# Patient Record
Sex: Female | Born: 1935 | Race: Black or African American | Hispanic: No | Smoking: Never smoker
Health system: Southern US, Community
[De-identification: ages and names within clinical notes are randomized; demographics above are authoritative.]

## PROBLEM LIST (undated history)

## (undated) DIAGNOSIS — R011 Cardiac murmur, unspecified: Secondary | ICD-10-CM

## (undated) DIAGNOSIS — R51 Headache: Secondary | ICD-10-CM

## (undated) DIAGNOSIS — M545 Low back pain, unspecified: Secondary | ICD-10-CM

## (undated) DIAGNOSIS — K219 Gastro-esophageal reflux disease without esophagitis: Secondary | ICD-10-CM

## (undated) DIAGNOSIS — G8929 Other chronic pain: Secondary | ICD-10-CM

## (undated) DIAGNOSIS — T7840XA Allergy, unspecified, initial encounter: Secondary | ICD-10-CM

## (undated) DIAGNOSIS — M109 Gout, unspecified: Secondary | ICD-10-CM

## (undated) DIAGNOSIS — J449 Chronic obstructive pulmonary disease, unspecified: Secondary | ICD-10-CM

## (undated) DIAGNOSIS — M1990 Unspecified osteoarthritis, unspecified site: Secondary | ICD-10-CM

## (undated) DIAGNOSIS — Z9289 Personal history of other medical treatment: Secondary | ICD-10-CM

## (undated) DIAGNOSIS — F329 Major depressive disorder, single episode, unspecified: Secondary | ICD-10-CM

## (undated) DIAGNOSIS — R0609 Other forms of dyspnea: Secondary | ICD-10-CM

## (undated) DIAGNOSIS — I509 Heart failure, unspecified: Secondary | ICD-10-CM

## (undated) DIAGNOSIS — E875 Hyperkalemia: Secondary | ICD-10-CM

## (undated) DIAGNOSIS — B1920 Unspecified viral hepatitis C without hepatic coma: Secondary | ICD-10-CM

## (undated) DIAGNOSIS — I10 Essential (primary) hypertension: Secondary | ICD-10-CM

## (undated) DIAGNOSIS — D509 Iron deficiency anemia, unspecified: Secondary | ICD-10-CM

## (undated) DIAGNOSIS — J189 Pneumonia, unspecified organism: Secondary | ICD-10-CM

## (undated) DIAGNOSIS — J45909 Unspecified asthma, uncomplicated: Secondary | ICD-10-CM

## (undated) DIAGNOSIS — R0600 Dyspnea, unspecified: Secondary | ICD-10-CM

## (undated) DIAGNOSIS — Z8489 Family history of other specified conditions: Secondary | ICD-10-CM

## (undated) DIAGNOSIS — I209 Angina pectoris, unspecified: Secondary | ICD-10-CM

## (undated) DIAGNOSIS — N390 Urinary tract infection, site not specified: Secondary | ICD-10-CM

## (undated) HISTORY — DX: Allergy, unspecified, initial encounter: T78.40XA

## (undated) HISTORY — DX: Hyperkalemia: E87.5

## (undated) HISTORY — PX: TOTAL KNEE ARTHROPLASTY: SHX125

## (undated) HISTORY — PX: DILATION AND CURETTAGE OF UTERUS: SHX78

## (undated) HISTORY — PX: JOINT REPLACEMENT: SHX530

---
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
## 2004-07-30 HISTORY — PX: CHOLECYSTECTOMY: SHX55

## 2005-05-14 ENCOUNTER — Ambulatory Visit (HOSPITAL_COMMUNITY): Admission: RE | Admit: 2005-05-14 | Discharge: 2005-05-14 | Payer: Self-pay | Admitting: Internal Medicine

## 2005-05-27 ENCOUNTER — Observation Stay (HOSPITAL_COMMUNITY): Admission: EM | Admit: 2005-05-27 | Discharge: 2005-05-28 | Payer: Self-pay | Admitting: *Deleted

## 2005-06-28 ENCOUNTER — Ambulatory Visit (HOSPITAL_COMMUNITY): Admission: RE | Admit: 2005-06-28 | Discharge: 2005-06-28 | Payer: Self-pay | Admitting: General Surgery

## 2005-07-04 ENCOUNTER — Encounter: Payer: Self-pay | Admitting: Cardiology

## 2005-07-04 ENCOUNTER — Ambulatory Visit: Admission: RE | Admit: 2005-07-04 | Discharge: 2005-07-04 | Payer: Self-pay | Admitting: General Surgery

## 2005-07-04 ENCOUNTER — Ambulatory Visit: Payer: Self-pay | Admitting: Cardiology

## 2005-07-20 ENCOUNTER — Encounter (INDEPENDENT_AMBULATORY_CARE_PROVIDER_SITE_OTHER): Payer: Self-pay | Admitting: Specialist

## 2005-07-20 ENCOUNTER — Ambulatory Visit (HOSPITAL_COMMUNITY): Admission: RE | Admit: 2005-07-20 | Discharge: 2005-07-21 | Payer: Self-pay | Admitting: General Surgery

## 2005-11-23 ENCOUNTER — Ambulatory Visit: Payer: Self-pay | Admitting: Gastroenterology

## 2006-01-18 ENCOUNTER — Encounter (INDEPENDENT_AMBULATORY_CARE_PROVIDER_SITE_OTHER): Payer: Self-pay | Admitting: *Deleted

## 2006-01-18 ENCOUNTER — Ambulatory Visit (HOSPITAL_COMMUNITY): Admission: RE | Admit: 2006-01-18 | Discharge: 2006-01-18 | Payer: Self-pay | Admitting: Gastroenterology

## 2006-04-11 ENCOUNTER — Ambulatory Visit: Payer: Self-pay | Admitting: Nurse Practitioner

## 2007-06-17 ENCOUNTER — Emergency Department (HOSPITAL_COMMUNITY): Admission: EM | Admit: 2007-06-17 | Discharge: 2007-06-17 | Payer: Self-pay | Admitting: Emergency Medicine

## 2007-10-01 ENCOUNTER — Ambulatory Visit (HOSPITAL_COMMUNITY): Admission: RE | Admit: 2007-10-01 | Discharge: 2007-10-01 | Payer: Self-pay | Admitting: Internal Medicine

## 2007-11-05 ENCOUNTER — Encounter: Admission: RE | Admit: 2007-11-05 | Discharge: 2007-11-05 | Payer: Self-pay | Admitting: Orthopedic Surgery

## 2008-08-24 ENCOUNTER — Emergency Department (HOSPITAL_COMMUNITY): Admission: EM | Admit: 2008-08-24 | Discharge: 2008-08-24 | Payer: Self-pay | Admitting: Emergency Medicine

## 2009-08-15 ENCOUNTER — Emergency Department (HOSPITAL_BASED_OUTPATIENT_CLINIC_OR_DEPARTMENT_OTHER): Admission: EM | Admit: 2009-08-15 | Discharge: 2009-08-15 | Payer: Self-pay | Admitting: Emergency Medicine

## 2009-10-05 ENCOUNTER — Ambulatory Visit (HOSPITAL_COMMUNITY): Admission: RE | Admit: 2009-10-05 | Discharge: 2009-10-05 | Payer: Self-pay | Admitting: Internal Medicine

## 2010-08-20 ENCOUNTER — Encounter: Payer: Self-pay | Admitting: Gastroenterology

## 2010-08-20 ENCOUNTER — Encounter: Payer: Self-pay | Admitting: Internal Medicine

## 2010-08-21 ENCOUNTER — Encounter: Payer: Self-pay | Admitting: Internal Medicine

## 2010-10-16 LAB — BASIC METABOLIC PANEL
BUN: 15 mg/dL (ref 6–23)
CO2: 31 mEq/L (ref 19–32)
Calcium: 9.1 mg/dL (ref 8.4–10.5)
Chloride: 103 mEq/L (ref 96–112)
Creatinine, Ser: 0.8 mg/dL (ref 0.4–1.2)
GFR calc Af Amer: >60 mL/min (ref >60)
GFR calc non Af Amer: >60 mL/min (ref >60)
Glucose, Bld: 108 mg/dL — ABNORMAL HIGH (ref 70–99)
Potassium: 3.5 mEq/L (ref 3.5–5.1)
Sodium: 143 mEq/L (ref 135–145)

## 2010-10-16 LAB — CBC
HCT: 36.9 % (ref 36.0–46.0)
Hemoglobin: 12.2 g/dL (ref 12.0–15.0)
MCHC: 33 g/dL (ref 30.0–36.0)
MCV: 85.5 fL (ref 78.0–100.0)
Platelets: 284 10*3/uL (ref 150–400)
RBC: 4.31 MIL/uL (ref 3.87–5.11)
RDW: 13.9 % (ref 11.5–15.5)
WBC: 7.8 10*3/uL (ref 4.0–10.5)

## 2010-10-16 LAB — DIFFERENTIAL
Basophils Absolute: 0.2 10*3/uL — ABNORMAL HIGH (ref 0.0–0.1)
Basophils Relative: 3 % — ABNORMAL HIGH (ref 0–1)
Eosinophils Absolute: 0.1 10*3/uL (ref 0.0–0.7)
Eosinophils Relative: 1 % (ref 0–5)
Lymphocytes Relative: 20 % (ref 12–46)
Lymphs Abs: 1.5 10*3/uL (ref 0.7–4.0)
Monocytes Absolute: 0.7 10*3/uL (ref 0.1–1.0)
Monocytes Relative: 9 % (ref 3–12)
Neutro Abs: 5.3 10*3/uL (ref 1.7–7.7)
Neutrophils Relative %: 68 % (ref 43–77)

## 2010-12-15 NOTE — Op Note (Signed)
NAMESHASHA, BUCHBINDER                ACCOUNT NO.:  [id number]   MEDICAL RECORD NO.:  [id number]          PATIENT TYPE:  OIB   LOCATION:  1613                         FACILITY:  [facility]   PHYSICIAN:  Leonie Man, M.D.   DATE OF BIRTH:  [birth year]   DATE OF PROCEDURE:  07/20/2005  DATE OF DISCHARGE:  07/21/2005                                 OPERATIVE REPORT   PREOPERATIVE DIAGNOSIS:  Chronic calculous cholecystitis.   POSTOPERATIVE DIAGNOSIS:  Chronic calculous cholecystitis.   PROCEDURE:  Laparoscopic cholecystectomy.   SURGEON:  Ballen.   ASSISTANT:  Dr. Marcy Panning.   ANESTHESIA:  General.   SPECIMENS TO PATHOLOGY LAB:  Gallbladder with stones.   ESTIMATED BLOOD LOSS:  Minimal.   COMPLICATIONS:  There are no complications and the patient returned to the  PACU in good condition.   INDICATIONS:  The patient is a 77 year old female with history of abdominal  pain, nausea, and vomiting, documented gallstones on ultrasound, without any  acute inflammatory changes. The patient comes to the operating room after  risks and potential benefits of surgery have been discussed. All questions  answered, consent obtained.   PROCEDURE IN DETAIL:  Following the induction of satisfactory general  anesthesia, the patient is positioned supinely. The abdomen prepped and  draped to be included in a sterile operative field. Open laparoscopy created  at the umbilicus with insertion of a Hassan cannula. The abdomen is  insufflated with carbon dioxide to 14 mmHg pressure. The camera is inserted  and visual exploration carried out.  The liver shows nodular cirrhosis. The  gallbladder is not inflamed but is redundant. Multiple adhesions from  chronic inflammation to the gallbladder from the omentum and the duodenum  are noted.  Neither the small or large intestine appeared to be abnormal.  Pelvic organs were not visualized.   Under direct vision, epigastric and lateral ports were placed. The  gallbladder is grasped and retracted cephalad. This was somewhat difficult  due to the patient's hard, cirrhotic liver. Dissection was carried out,  taking down the adhesions from the gallbladder were carried out, dissection  carried down to the ampulla where the cystic artery and cystic duct were  positively identified tracing the cystic artery up to the cystic and  attention was to gallbladder wall and tracing up the cystic duct gallbladder  junction. The cystic duct was clipped proximally and opened. A cystic duct  cholangiogram was carried out by passing a Cook catheter into the abdomen  and placing it into the cystic duct through which one half strength  Omnipaque was injected into the extrahepatic biliary system under  fluoroscopic guidance. The resulting cholangiogram showed free flow of  contrast into the duodenum, normal distal tapering.  Both right and left  hepatic radicles appeared to be normal and there were no filling defects.   The cholangiocatheter was removed and the cystic duct was triply clipped and  transected. The cystic artery was doubly clipped and transected. The  gallbladder was then dissected free from the liver bed using electrocautery  throughout the course of dissection. At the end of  dissection, the  gallbladder was placed in an Endopouch. The liver bed was again inspected  and additional bleeding points treated with electrocautery. The right upper  quadrant then thoroughly irrigated with multiple aliquots of normal saline.  The camera moved to the epigastric port to allow removal of the gallbladder  through the umbilical port which are proceeded without difficulty. Sponge,  instrument, and sharp counts were then verified. The pneumoperitoneum was  allowed to deflate after the trocars were removed under direct vision.  Wounds were closed in layers as follows: The umbilical wound into layers  with zero Vicryl and 4-0 Monocryl, epigastric and lateral flank  wounds  closed with 4-0 Monocryl sutures. All wounds reinforced with Steri-Strips.  Sterile dressings applied. Anesthetic reversed and the patient removed from  the operating room to the recovery room in stable condition. She tolerated  the procedure well.      Leonie Man, M.D.  Electronically Signed     PB/MEDQ  D:  07/20/2005  T:  07/24/2005  Job:  [id number]   cc:   Fleet Contras, M.D.  Fax: [phone]

## 2011-04-06 ENCOUNTER — Other Ambulatory Visit: Payer: Self-pay | Admitting: Surgery

## 2011-04-23 ENCOUNTER — Telehealth: Payer: Self-pay | Admitting: Gastroenterology

## 2011-04-23 NOTE — Telephone Encounter (Addendum)
Dr Jacqualine Mau, This patient called to make a f/u appt with you in [facility]. She states that she is having a lot of itching. She could not reach the clinic because she is getting a message that the phone number is not a working number. Please advise. [phone] Thanks Lowell Guitar  Called on 04/20/11 1427 hrs and left a message on her voicemail.

## 2011-04-23 NOTE — Telephone Encounter (Signed)
Dr Jacqualine Mau, This patient called to make a f/u appt with you in [facility]. She states that she is having a lot of itching. She could not reach the clinic because she is getting a message that the phone number is not a working number. Please advise. [phone] Thanks Lowell Guitar  Called a second time and left a message.  Forwarding this message to office staff to check on appts availability.

## 2011-05-08 LAB — I-STAT 8, (EC8 V) (CONVERTED LAB)
Acid-Base Excess: 7 — ABNORMAL HIGH
BUN: 12
Bicarbonate: 32.9 — ABNORMAL HIGH
Chloride: 102
Glucose, Bld: 90
HCT: 37
Hemoglobin: 12.6
Operator id: 198171
Potassium: 3.6
Sodium: 139
TCO2: 34
pCO2, Ven: 50.7 — ABNORMAL HIGH
pH, Ven: 7.42 — ABNORMAL HIGH

## 2011-05-08 LAB — POCT I-STAT CREATININE
Creatinine, Ser: 0.8
Operator id: 198171

## 2011-05-08 LAB — HEPATIC FUNCTION PANEL
ALT: 25
AST: 45 — ABNORMAL HIGH
Albumin: 3.1 — ABNORMAL LOW
Alkaline Phosphatase: 66
Bilirubin, Direct: 0.2
Indirect Bilirubin: 0.4
Total Bilirubin: 0.6
Total Protein: 6.6

## 2011-05-08 LAB — PROTIME-INR
INR: 1.1
Prothrombin Time: 14.8

## 2011-05-10 ENCOUNTER — Ambulatory Visit (INDEPENDENT_AMBULATORY_CARE_PROVIDER_SITE_OTHER): Payer: Medicare Other | Admitting: Gastroenterology

## 2011-05-10 DIAGNOSIS — B182 Chronic viral hepatitis C: Secondary | ICD-10-CM

## 2011-05-18 NOTE — Progress Notes (Signed)
NAME:  FLORENTINE, DIEKMAN  MR#:  [id number]      DATE:  05/10/2011  DOB:  1932-08-01    cc: Consulting Physician:  Vashti Hey, MD, [facility], [address], [place], [place] [address], [place] [id number]  Ancil Boozer, MD, [facility] and [facility], [address] [place], [place] [address], [place] [phone]  Referring Physician:  Junius Creamer, MD, [facility], [address], [place], [place] [address], Fax [phone]    REASON FOR REFERRAL:  Genotype 1a hepatitis C.   History:  The patient is a 77 year old woman who I have been asked to see in consultation by Dr. Maurene Capes regarding her genotype 1a hepatitis C.  The patient previously followed by our clinic for her genotype 1a hepatitis C. She was first diagnosed with hepatitis C after knee surgery in 2001. She underwent a liver biopsy at [facility] in 2003, which showed grade 3-4, stage II disease. She was then treated somewhere around 2004 to 2005. We do not have any records as to her treatment but at minimum she is a relapser. She underwent another liver out liver biopsy on 01/18/2006, showing grade 2, stage I disease without stainable iron.   When I first saw her on 11/23/2005, we discussed the possibility of treating her with another course of Pegasys and ribavirin because it is my suspicion that she did not get full doses when previously treated. She then was seen in follow up on 04/11/2006, by the nurse practitioner and at that time it was decided she would she would think about treatment and was suppose to be seen in follow up in 6 months' time. The next note I have was from 06/17/2007, which only indicated she was sent to the emergency room, but no other documentation, if a visit even occurred. I have not received a set of labs from Dr. Maurene Capes with a new referral.  Currently, the patient complains of significant pruritus that has been  present for several weeks to a few months. She reports that it is primarily on her hands, legs, and scalp and it is constant. She finds  hydroxyzine somewhat effective but also sedating. She has seen Dr. Terri Piedra regarding this.  I do not have any records regarding this. There is no accompanying jaundice. She denies any acholic stools  or dark urine. There are no other symptoms to suggest decompensated liver disease either. There is no history of GI bleeding. There are no symptoms of encephalopathy such as day/night reversal or frank  confusion. She has some peripheral edema, which she attributes to eating more salt in her diet of late but is certain this will get under control when she restricts sodium in the next few days.  There is no ascites or hepatic hydrothorax history.  With respect to risk factors for liver disease, she denies any significant alcohol over the course her lifetime.  There is no history of tattooing or intravenous drug use. There is no history of blood  transfusions prior to 1992 or family history of liver disease. She is immune to hepatitis A and was to undergo vaccination against hepatitis B, but never followed up in our clinic for this.   PAST MEDICAL HISTORY:  Otherwise significant for gout, gastroesophageal reflux disease, hypertension, but denies any history of diabetes or dyslipidemia.   PAST SURGICAL HISTORY:  Tonsillectomy and adenoidectomy as a child, knee surgeries and cholecystectomy in 2006, at [facility].  Past psychiatric history:  She previously has been seen in [facility] for depression, but reports that over a year ago when she missed 2 appointments, she was  discharged from whatever clinic she was being seen at for lack of compliance.   CURRENT MEDICATIONS:  Doxepin dose unknown, dosing interval unknown, Benicar 40 mg p.o. dose interval unknown, Lexapro dose unknown dosing interval unknown, Prilosec dose unknown, dosing interval unknown,  potassium chloride 10  mg dose interval unknown, clonazepam 5 mg p.o. daily, hydrochlorothiazide 25 mg p.o. daily, hydroxyzine 10 mg p.o. b.i.d. This is as reported as by they patient.   According to Dr. Maurene Capes notes, she is on doxepin 50 mg p.o. at bedtime, Advair 250/50 one inhalation q. 12 hours, colchicine 0.6 mg b.i.d., potassium chloride 10 mg p.o. daily, clonidine 0.1 mg b.i.d.,  Ventolin 2 inhalations q. 4 hours p.r.n., Vistaril 25 mg q. 6 hours p.r.n., clonazepam 0.5 mg p.o. b.i.d., Lexapro 10 mg p.o. daily,  Prilosec 40 mg p.o. daily, hydrochlorothiazide 25 mg p.o. daily, Benicar 40 mg p.o. daily.   ALLERGIES:  Sulfa causes hives. Contrast causes hives. Celebrex causes pruritus. Norvasc causes pruritus.   Habits:  Smoking, denies. Alcohol, denies.   FAMILY HISTORY:  As above.   SOCIAL HISTORY:  The patient is divorced from her first husband and then widowed. She has 3 children. She is a retired Air traffic controller.   REVIEW OF SYSTEMS:  All 10 systems reviewed today with the patient and they are negative other than which is mentioned above. CES-D was incomplete.   PHYSICAL EXAMINATION:   Constitutional:  Appeared stated age with central obesity. Vital signs: Height 60 inches, weight 223 pounds, blood pressure 180/75, pulse 74, temperature 97.6 Fahrenheit. This is in comparison to when  first seen by me on 11/23/2005, at which time, she weighed 195 pounds, blood pressure 180/75, pulse 74, temperature 97.6.  Ears, nose, mouth,  and throat: Unremarkable oropharynx. No thyromegaly or neck masses. Chest: Resonant to percussion. Clear to auscultation.  Cardiovascular:  Normal S1, S2. No murmurs or rubs. There is no significant peripheral edema.  Abdominal:  Normal bowel sounds. No masses or tenderness. I could not appreciate liver edge or spleen tip. There is no ascites or hernias. The abdomen is somewhat obese. Lymphatics: No cervical adenopathy.  Central nervous system: No  asterixis or focal neurologic findings. Dermatologic: Anicteric sclera. Pupils were equal reactive to light.   Laboratories:  From the referring physician's labs on 03/15/2011, showed a creatinine of 0.73, albumin of 3.6, total bilirubin 0.3, globulins of 2.9, AST 46, ALT 28, ALP 76, total bilirubin 0.3.  Hepatitis B surface antigen  was negative and her hepatitis C antibody was positive. Her qualitative HCV RNA was positive, as well.  ASSESSMENT: The patient is a 77 year old woman with history of genotype 1 hepatitis C with the most recent biopsy on 01/18/2006, showing grade 2 stage I disease.  She is treatment experienced although we  could not fully characterize her treatment response.  In terms of her hepatitis C, at the age of 77 with a previous nonresponse of some sort to treatment, and significant obesity, I think that she is a poor candidate for treatment, and would not  tolerate the significant side effects that would occur with triple therapy.  I should also take into account the fact that there is a history of depression for which she is not under therapy for at this time. I think a number of these relative contraindications  add up to  contraindication to treating her for hepatitis C.  I think the issue of treating her hepatitis C is now a closed issue and her morbidities from other conditions may be equally as important if not more important then trying to treat her hepatitis C.  In terms of her complaints of pruritus attributed to her hepatitis C, her bilirubin is 0.3. It would be very unusual for someone to develop significant pruritus from liver disease with such a normal bilirubin.  Furthermore, I have not received any records from Dr. Terri Piedra regarding her pruritus if that is why Dr. Maurene Capes has referred her to me. So I do not think this is a result of the liver disease. I wonder about the  possibility of pruritus as a consequence of some of her medications including the clonazepam.  She is not under the care of a psychiatrist now. I gather that these medications are through Dr. Maurene Capes and I  would therefore suggest that an attempt to be made to taper the clonazepam. Sertraline has been used to treat complaints of pruritus, but she is already on Lexapro and again if there is any need to treat  the pruritis with an SSRI, I will leave it to Dr. Tyson Dense to decide whether it is worth stopping the Lexapro in favor of the sertraline. Doxepin may have some beneficial effect on the complaints of pruritus.  At 50 mg a day, this is a small dose that is used for sleeping, but perhaps she could tolerate a larger dose especially in view of her weight. This may help with her pruritus.  In my discussion today with the patient, I discussed the fact she is not a candidate for treatment of hepatitis C and does not need follow up for this. We discussed the pruritus. I explained to her with a  bilirubin 0.3, I do not think that the pruritus is coming from liver disease. However, I explained to her it is possible clonazepam may be as a side effect causing pruritus and that she may benefit from  switching Lexapro to sertraline, and increase the doxepin to 50 mg p.o. to 100 mg p.o. at bedtime for symptomatic relief especially when sleeping.   Plan:  1. She is hepatitis A immune. 2. Hepatitis B vaccination was never completed and she wanted this done today, regardless of the fact I thought I explained to her that is probably unnecessary. To that end, she got her first dose today and will arrange for nursing visit in a month now and 6 months from now. 3. As above, I suggested that she increase the doxepin from 50 mg  to 100 mg p.o. at bedtime which I will leave to Dr. Tyson Dense, to do if he agrees. 4. I have also suggested tapering the clonazepam off if Dr. Tyson Dense agrees. 5. There is no need for follow up with me.            Brooke Dare, MD   [phone]  D:  Thu Oct 11 17:58:36 2012 ; T:  Thu Oct  11 19:47:20 2012  Job #:  [id number]

## 2011-06-07 ENCOUNTER — Ambulatory Visit: Payer: Medicare Other | Admitting: Gastroenterology

## 2012-06-10 ENCOUNTER — Emergency Department (HOSPITAL_COMMUNITY): Payer: Medicare Other

## 2012-06-10 ENCOUNTER — Observation Stay (HOSPITAL_COMMUNITY)
Admission: EM | Admit: 2012-06-10 | Discharge: 2012-06-12 | Disposition: A | Discharge status: Home / Self Care | Payer: Medicare Other | Attending: Family Medicine | Admitting: Family Medicine

## 2012-06-10 ENCOUNTER — Encounter (HOSPITAL_COMMUNITY): Payer: Self-pay | Admitting: Radiology

## 2012-06-10 DIAGNOSIS — Z79899 Other long term (current) drug therapy: Secondary | ICD-10-CM | POA: Insufficient documentation

## 2012-06-10 DIAGNOSIS — R0602 Shortness of breath: Secondary | ICD-10-CM | POA: Insufficient documentation

## 2012-06-10 DIAGNOSIS — R609 Edema, unspecified: Secondary | ICD-10-CM

## 2012-06-10 DIAGNOSIS — D649 Anemia, unspecified: Secondary | ICD-10-CM | POA: Diagnosis present

## 2012-06-10 DIAGNOSIS — R001 Bradycardia, unspecified: Secondary | ICD-10-CM | POA: Diagnosis present

## 2012-06-10 DIAGNOSIS — I509 Heart failure, unspecified: Secondary | ICD-10-CM | POA: Insufficient documentation

## 2012-06-10 DIAGNOSIS — F3289 Other specified depressive episodes: Secondary | ICD-10-CM | POA: Insufficient documentation

## 2012-06-10 DIAGNOSIS — B1920 Unspecified viral hepatitis C without hepatic coma: Secondary | ICD-10-CM | POA: Diagnosis present

## 2012-06-10 DIAGNOSIS — R600 Localized edema: Secondary | ICD-10-CM | POA: Diagnosis present

## 2012-06-10 DIAGNOSIS — M109 Gout, unspecified: Secondary | ICD-10-CM | POA: Diagnosis present

## 2012-06-10 DIAGNOSIS — F32A Depression, unspecified: Secondary | ICD-10-CM | POA: Diagnosis present

## 2012-06-10 DIAGNOSIS — I10 Essential (primary) hypertension: Secondary | ICD-10-CM | POA: Diagnosis present

## 2012-06-10 DIAGNOSIS — J4489 Other specified chronic obstructive pulmonary disease: Secondary | ICD-10-CM | POA: Insufficient documentation

## 2012-06-10 DIAGNOSIS — F329 Major depressive disorder, single episode, unspecified: Secondary | ICD-10-CM

## 2012-06-10 DIAGNOSIS — E669 Obesity, unspecified: Secondary | ICD-10-CM | POA: Insufficient documentation

## 2012-06-10 DIAGNOSIS — M7989 Other specified soft tissue disorders: Secondary | ICD-10-CM | POA: Insufficient documentation

## 2012-06-10 DIAGNOSIS — J449 Chronic obstructive pulmonary disease, unspecified: Secondary | ICD-10-CM | POA: Diagnosis present

## 2012-06-10 DIAGNOSIS — J45909 Unspecified asthma, uncomplicated: Secondary | ICD-10-CM

## 2012-06-10 DIAGNOSIS — K219 Gastro-esophageal reflux disease without esophagitis: Secondary | ICD-10-CM | POA: Diagnosis present

## 2012-06-10 DIAGNOSIS — D509 Iron deficiency anemia, unspecified: Secondary | ICD-10-CM

## 2012-06-10 DIAGNOSIS — N390 Urinary tract infection, site not specified: Secondary | ICD-10-CM | POA: Diagnosis present

## 2012-06-10 DIAGNOSIS — I498 Other specified cardiac arrhythmias: Secondary | ICD-10-CM | POA: Insufficient documentation

## 2012-06-10 DIAGNOSIS — R079 Chest pain, unspecified: Principal | ICD-10-CM | POA: Diagnosis present

## 2012-06-10 HISTORY — DX: Cardiac murmur, unspecified: R01.1

## 2012-06-10 HISTORY — DX: Other forms of dyspnea: R06.09

## 2012-06-10 HISTORY — DX: Essential (primary) hypertension: I10

## 2012-06-10 HISTORY — DX: Unspecified viral hepatitis C without hepatic coma: B19.20

## 2012-06-10 HISTORY — DX: Major depressive disorder, single episode, unspecified: F32.9

## 2012-06-10 HISTORY — DX: Iron deficiency anemia, unspecified: D50.9

## 2012-06-10 HISTORY — DX: Unspecified osteoarthritis, unspecified site: M19.90

## 2012-06-10 HISTORY — DX: Low back pain: M54.5

## 2012-06-10 HISTORY — DX: Depression, unspecified: F32.A

## 2012-06-10 HISTORY — DX: Gout, unspecified: M10.9

## 2012-06-10 HISTORY — DX: Unspecified asthma, uncomplicated: J45.909

## 2012-06-10 HISTORY — DX: Urinary tract infection, site not specified: N39.0

## 2012-06-10 HISTORY — DX: Gastro-esophageal reflux disease without esophagitis: K21.9

## 2012-06-10 HISTORY — DX: Dyspnea, unspecified: R06.00

## 2012-06-10 HISTORY — DX: Chronic obstructive pulmonary disease, unspecified: J44.9

## 2012-06-10 HISTORY — DX: Pneumonia, unspecified organism: J18.9

## 2012-06-10 HISTORY — DX: Heart failure, unspecified: I50.9

## 2012-06-10 HISTORY — DX: Headache: R51

## 2012-06-10 HISTORY — DX: Angina pectoris, unspecified: I20.9

## 2012-06-10 HISTORY — DX: Personal history of other medical treatment: Z92.89

## 2012-06-10 HISTORY — DX: Other chronic pain: G89.29

## 2012-06-10 HISTORY — DX: Low back pain, unspecified: M54.50

## 2012-06-10 LAB — CBC WITH DIFFERENTIAL/PLATELET
Basophils Absolute: 0 10*3/uL (ref 0.0–0.1)
Basophils Relative: 0 % (ref 0–1)
Eosinophils Relative: 1 % (ref 0–5)
HCT: 33.2 % — ABNORMAL LOW (ref 36.0–46.0)
MCHC: 31.6 g/dL (ref 30.0–36.0)
Monocytes Absolute: 0.8 10*3/uL (ref 0.1–1.0)
Neutro Abs: 4.8 10*3/uL (ref 1.7–7.7)
RDW: 18.1 % — ABNORMAL HIGH (ref 11.5–15.5)

## 2012-06-10 LAB — CBC
HCT: 38.6 % (ref 36.0–46.0)
Hemoglobin: 12.6 g/dL (ref 12.0–15.0)
MCH: 26.8 pg (ref 26.0–34.0)
MCHC: 32.6 g/dL (ref 30.0–36.0)
RBC: 4.7 MIL/uL (ref 3.87–5.11)

## 2012-06-10 LAB — POCT I-STAT TROPONIN I: Troponin i, poc: 0.01 ng/mL (ref 0.00–0.08)

## 2012-06-10 LAB — URINALYSIS, ROUTINE W REFLEX MICROSCOPIC
Nitrite: NEGATIVE
Specific Gravity, Urine: 1.012 (ref 1.005–1.030)
Urobilinogen, UA: 0.2 mg/dL (ref 0.0–1.0)

## 2012-06-10 LAB — CK TOTAL AND CKMB (NOT AT ARMC): Total CK: 196 U/L — ABNORMAL HIGH (ref 7–177)

## 2012-06-10 LAB — TROPONIN I: Troponin I: <0.3 ng/mL (ref <0.30)

## 2012-06-10 LAB — COMPREHENSIVE METABOLIC PANEL
AST: 23 U/L (ref 0–37)
Albumin: 3 g/dL — ABNORMAL LOW (ref 3.5–5.2)
Calcium: 9.6 mg/dL (ref 8.4–10.5)
Creatinine, Ser: 0.8 mg/dL (ref 0.50–1.10)
Total Protein: 6.3 g/dL (ref 6.0–8.3)

## 2012-06-10 LAB — CREATININE, SERUM: GFR calc non Af Amer: 81 mL/min — ABNORMAL LOW (ref >90)

## 2012-06-10 LAB — APTT: aPTT: 32 seconds (ref 24–37)

## 2012-06-10 LAB — URINE MICROSCOPIC-ADD ON

## 2012-06-10 LAB — MAGNESIUM: Magnesium: 1.9 mg/dL (ref 1.5–2.5)

## 2012-06-10 LAB — LIPASE, BLOOD: Lipase: 25 U/L (ref 11–59)

## 2012-06-10 MED ORDER — DEXTROSE 5 % IV SOLN
1.0000 g | INTRAVENOUS | Status: DC
  Administered 2012-06-11: 1 g via INTRAVENOUS
  Filled 2012-06-10 (×2): qty 10

## 2012-06-10 MED ORDER — ACETAMINOPHEN 325 MG PO TABS: 650.0000 mg | ORAL_TABLET | Freq: Four times a day (QID) | ORAL | Status: DC | PRN

## 2012-06-10 MED ORDER — ACETAMINOPHEN 650 MG RE SUPP: 650.0000 mg | Freq: Four times a day (QID) | RECTAL | Status: DC | PRN

## 2012-06-10 MED ORDER — PANTOPRAZOLE SODIUM 40 MG PO TBEC
40.0000 mg | DELAYED_RELEASE_TABLET | Freq: Every day | ORAL | Status: DC
  Administered 2012-06-11 – 2012-06-12 (×2): 40 mg via ORAL
  Filled 2012-06-10: qty 1

## 2012-06-10 MED ORDER — ALUM & MAG HYDROXIDE-SIMETH 200-200-20 MG/5ML PO SUSP: 30.0000 mL | Freq: Four times a day (QID) | ORAL | Status: DC | PRN

## 2012-06-10 MED ORDER — SODIUM CHLORIDE 0.9 % IJ SOLN: 3.0000 mL | Freq: Two times a day (BID) | INTRAMUSCULAR | Status: DC

## 2012-06-10 MED ORDER — GI COCKTAIL ~~LOC~~
30.0000 mL | Freq: Three times a day (TID) | ORAL | Status: DC | PRN
Start: 2012-06-10 — End: 2012-06-12

## 2012-06-10 MED ORDER — SENNOSIDES-DOCUSATE SODIUM 8.6-50 MG PO TABS
1.0000 | ORAL_TABLET | Freq: Every evening | ORAL | Status: DC | PRN
  Filled 2012-06-10: qty 1

## 2012-06-10 MED ORDER — LISINOPRIL 10 MG PO TABS
10.0000 mg | ORAL_TABLET | Freq: Every day | ORAL | Status: DC
  Administered 2012-06-11 – 2012-06-12 (×2): 10 mg via ORAL
  Filled 2012-06-10 (×2): qty 1

## 2012-06-10 MED ORDER — BISACODYL 5 MG PO TBEC
10.0000 mg | DELAYED_RELEASE_TABLET | Freq: Every day | ORAL | Status: DC | PRN
  Administered 2012-06-11: 10 mg via ORAL
  Filled 2012-06-10 (×2): qty 2

## 2012-06-10 MED ORDER — HYDRALAZINE HCL 20 MG/ML IJ SOLN
10.0000 mg | Freq: Four times a day (QID) | INTRAMUSCULAR | Status: DC | PRN
  Administered 2012-06-10 – 2012-06-11 (×2): 10 mg via INTRAVENOUS
  Filled 2012-06-10: qty 1

## 2012-06-10 MED ORDER — HYDRALAZINE HCL 20 MG/ML IJ SOLN
INTRAMUSCULAR | Status: AC
  Filled 2012-06-10: qty 1

## 2012-06-10 MED ORDER — ONDANSETRON HCL 4 MG/2ML IJ SOLN: 4.0000 mg | Freq: Four times a day (QID) | INTRAMUSCULAR | Status: DC | PRN

## 2012-06-10 MED ORDER — IPRATROPIUM BROMIDE 0.02 % IN SOLN
0.5000 mg | Freq: Four times a day (QID) | RESPIRATORY_TRACT | Status: DC
  Administered 2012-06-10 – 2012-06-11 (×4): 0.5 mg via RESPIRATORY_TRACT
  Filled 2012-06-10 (×5): qty 2.5

## 2012-06-10 MED ORDER — OXYCODONE HCL 5 MG PO TABS
5.0000 mg | ORAL_TABLET | ORAL | Status: DC | PRN
  Administered 2012-06-10 – 2012-06-12 (×3): 5 mg via ORAL
  Filled 2012-06-10 (×3): qty 1

## 2012-06-10 MED ORDER — MOMETASONE FURO-FORMOTEROL FUM 100-5 MCG/ACT IN AERO
2.0000 | INHALATION_SPRAY | Freq: Two times a day (BID) | RESPIRATORY_TRACT | Status: DC
  Administered 2012-06-10 – 2012-06-12 (×3): 2 via RESPIRATORY_TRACT
  Filled 2012-06-10 (×2): qty 8.8

## 2012-06-10 MED ORDER — ENOXAPARIN SODIUM 40 MG/0.4ML ~~LOC~~ SOLN
40.0000 mg | SUBCUTANEOUS | Status: DC
  Administered 2012-06-10 – 2012-06-11 (×2): 40 mg via SUBCUTANEOUS
  Filled 2012-06-10 (×3): qty 0.4

## 2012-06-10 MED ORDER — INFLUENZA VIRUS VACC SPLIT PF IM SUSP
0.5000 mL | INTRAMUSCULAR | Status: AC
  Administered 2012-06-11: 0.5 mL via INTRAMUSCULAR
  Filled 2012-06-10: qty 0.5

## 2012-06-10 MED ORDER — ONDANSETRON HCL 4 MG PO TABS: 4.0000 mg | ORAL_TABLET | Freq: Four times a day (QID) | ORAL | Status: DC | PRN

## 2012-06-10 MED ORDER — ALBUTEROL SULFATE (5 MG/ML) 0.5% IN NEBU
2.5000 mg | INHALATION_SOLUTION | Freq: Four times a day (QID) | RESPIRATORY_TRACT | Status: DC
  Administered 2012-06-10 – 2012-06-11 (×4): 2.5 mg via RESPIRATORY_TRACT
  Filled 2012-06-10 (×5): qty 0.5

## 2012-06-10 MED ORDER — SODIUM CHLORIDE 0.9 % IV SOLN
250.0000 mL | INTRAVENOUS | Status: DC | PRN
  Administered 2012-06-11: 250 mL via INTRAVENOUS

## 2012-06-10 MED ORDER — HYDROCHLOROTHIAZIDE 25 MG PO TABS
25.0000 mg | ORAL_TABLET | Freq: Every day | ORAL | Status: DC
  Administered 2012-06-11: 25 mg via ORAL
  Filled 2012-06-10: qty 1

## 2012-06-10 MED ORDER — FUROSEMIDE 10 MG/ML IJ SOLN
60.0000 mg | Freq: Two times a day (BID) | INTRAMUSCULAR | Status: DC
Start: 2012-06-10 — End: 2012-06-11
  Administered 2012-06-11: 60 mg via INTRAVENOUS
  Filled 2012-06-10 (×3): qty 6

## 2012-06-10 MED ORDER — CLONIDINE HCL 0.1 MG PO TABS: 0.1000 mg | ORAL_TABLET | Freq: Two times a day (BID) | ORAL | Status: DC

## 2012-06-10 MED ORDER — ASPIRIN EC 81 MG PO TBEC
81.0000 mg | DELAYED_RELEASE_TABLET | Freq: Every day | ORAL | Status: DC
  Administered 2012-06-10 – 2012-06-12 (×3): 81 mg via ORAL
  Filled 2012-06-10 (×3): qty 1

## 2012-06-10 MED ORDER — POTASSIUM CHLORIDE CRYS ER 20 MEQ PO TBCR
20.0000 meq | EXTENDED_RELEASE_TABLET | Freq: Every day | ORAL | Status: DC
  Administered 2012-06-11 – 2012-06-12 (×2): 20 meq via ORAL
  Filled 2012-06-10 (×2): qty 1

## 2012-06-10 MED ORDER — HEPARIN SODIUM (PORCINE) 5000 UNIT/ML IJ SOLN
5000.0000 [IU] | Freq: Three times a day (TID) | INTRAMUSCULAR | Status: DC
  Filled 2012-06-10 (×2): qty 1

## 2012-06-10 MED ORDER — CLOBETASOL PROPIONATE 0.05 % EX CREA
1.0000 | TOPICAL_CREAM | Freq: Two times a day (BID) | CUTANEOUS | Status: DC
Start: 2012-06-10 — End: 2012-06-12
  Administered 2012-06-10 – 2012-06-12 (×4): 1 via TOPICAL
  Filled 2012-06-10: qty 15

## 2012-06-10 MED ORDER — IPRATROPIUM BROMIDE 0.02 % IN SOLN: 0.5000 mg | RESPIRATORY_TRACT | Status: DC | PRN

## 2012-06-10 MED ORDER — ALBUTEROL SULFATE (5 MG/ML) 0.5% IN NEBU: 2.5000 mg | INHALATION_SOLUTION | RESPIRATORY_TRACT | Status: DC | PRN

## 2012-06-10 MED ORDER — CLONIDINE HCL 0.1 MG PO TABS
0.1000 mg | ORAL_TABLET | Freq: Two times a day (BID) | ORAL | Status: DC
  Administered 2012-06-10 – 2012-06-11 (×2): 0.1 mg via ORAL
  Filled 2012-06-10 (×3): qty 1

## 2012-06-10 MED ORDER — SODIUM CHLORIDE 0.9 % IJ SOLN: 3.0000 mL | INTRAMUSCULAR | Status: DC | PRN

## 2012-06-10 MED ORDER — ESCITALOPRAM OXALATE 10 MG PO TABS
10.0000 mg | ORAL_TABLET | Freq: Every day | ORAL | Status: DC
  Administered 2012-06-11 – 2012-06-12 (×2): 10 mg via ORAL
  Filled 2012-06-10 (×2): qty 1

## 2012-06-10 MED ORDER — FERROUS FUMARATE 325 (106 FE) MG PO TABS
1.0000 | ORAL_TABLET | Freq: Two times a day (BID) | ORAL | Status: DC
  Administered 2012-06-10 – 2012-06-12 (×4): 106 mg via ORAL
  Filled 2012-06-10 (×6): qty 1

## 2012-06-10 MED ORDER — FLEET ENEMA 7-19 GM/118ML RE ENEM
1.0000 | ENEMA | Freq: Once | RECTAL | Status: AC | PRN
  Filled 2012-06-10: qty 1

## 2012-06-10 MED ORDER — FLUTICASONE PROPIONATE 50 MCG/ACT NA SUSP
2.0000 | Freq: Every day | NASAL | Status: DC
  Administered 2012-06-11 – 2012-06-12 (×2): 2 via NASAL
  Filled 2012-06-10: qty 16

## 2012-06-10 MED ORDER — SODIUM CHLORIDE 0.9 % IJ SOLN
3.0000 mL | Freq: Two times a day (BID) | INTRAMUSCULAR | Status: DC
  Administered 2012-06-10 – 2012-06-12 (×4): 3 mL via INTRAVENOUS

## 2012-06-10 MED ORDER — ASPIRIN EC 81 MG PO TBEC: 81.0000 mg | DELAYED_RELEASE_TABLET | Freq: Every day | ORAL | Status: DC

## 2012-06-10 NOTE — ED Notes (Addendum)
Patient transported to CT 

## 2012-06-10 NOTE — ED Notes (Signed)
Pt presents with midsternal CP X 3 days. Pt describes pain as tightness, non radiating. Pt denies any N, V, diapohersis 324 of ASA. 0.4 of nitro.

## 2012-06-10 NOTE — H&P (Signed)
[facility] History and Physical  Monique Lewis YQM:[id number] DOB: 02-22-1933 DOA: 06/10/2012  Referring physician: Dr. Karma Ganja PCP: Dorrene German, MD  Specialists: None  Chief Complaint: Lower extremity swelling  HPI: Monique Lewis is a 78 y.o. female with history of hypertension, CHF, gastroesophageal reflux disease, depression, gout, history of hep C after knee surgery in 2001 status post treatment, history of asthma who presents to the ED with a ten-day history of worsening bilateral lower extremity edema which has not improved despite Lasix. Patient also complained of some chest tightness denies been ongoing for the past 3 days which is midsternal and sometimes patient describes as a pressure. Patient states is nonradiating and over the past 3 days have become constant. Patient stated that usually associated with the change in weather and possibly her asthma. Patient does endorse some wheezing, some nausea, some diaphoresis, some constipation, some generalized weakness, some dysuria and foul-smelling urine. Patient also endorses a worsening of her heartburn which he states is not improving with the Nexium. Patient stated she tried Lasix however her lower extremity edema did not get any better. Recent stated that is also being treated for sciatic pain. Patient was seen in the ED EKG done today sinus bradycardia. Initial set of troponin was negative. Urinalysis done was consistent with a UTI. Chest x-ray was negative. Comprehensive metabolic profile and an albumin of 3.0 otherwise was unremarkable. CBC obtained and a hemoglobin of 10.5 otherwise was unremarkable. We were called to admit the patient for further evaluation and management.  Review of Systems: The patient denies anorexia, fever, weight loss,, vision loss, decreased hearing, hoarseness, chest pain, syncope, dyspnea on exertion, peripheral edema, balance deficits, hemoptysis, abdominal pain, melena, hematochezia, severe  indigestion/heartburn, hematuria, incontinence, genital sores, muscle weakness, suspicious skin lesions, transient blindness, difficulty walking, depression, unusual weight change, abnormal bleeding, enlarged lymph nodes, angioedema, and breast masses.    Past Medical History  Diagnosis Date  . Hypertension   . COPD (chronic obstructive pulmonary disease)   . HTN (hypertension) 06/10/2012  . Anemia 06/10/2012  . GERD (gastroesophageal reflux disease) 06/10/2012  . Depression 06/10/2012  . Hepatitis-C 06/10/2012    Hx of Hep C after knee surgery 2001 s/p treatment per patient.  . Gout 06/10/2012  . Asthma 06/10/2012   Past Surgical History  Procedure Date  . Tonsillectomy   . Cholecystectomy 2006  . Joint replacement     s/p 5 knee replacements   Social History:  does not have a smoking history on file. She does not have any smokeless tobacco history on file. Her alcohol and drug histories not on file.   Allergies  Allergen Reactions  . Celebrex (Celecoxib) Itching  . Sulfa Antibiotics Itching    History reviewed. No pertinent family history.   Prior to Admission medications   Medication Sig Start Date End Date Taking? Authorizing Provider  clobetasol cream (TEMOVATE) 0.05 % Apply 1 application topically 2 (two) times daily.   Yes Historical Provider, MD  cloNIDine (CATAPRES) 0.1 MG tablet Take 0.1 mg by mouth 2 (two) times daily.   Yes Historical Provider, MD  escitalopram (LEXAPRO) 10 MG tablet Take 10 mg by mouth daily.   Yes Historical Provider, MD  esomeprazole (NEXIUM) 40 MG capsule Take 40 mg by mouth daily before breakfast.   Yes Historical Provider, MD  ferrous fumarate (HEMOCYTE - 106 MG FE) 325 (106 FE) MG TABS Take 1 tablet by mouth 2 (two) times daily.   Yes Historical Provider, MD  fluticasone (  FLONASE) 50 MCG/ACT nasal spray Place 2 sprays into the nose daily.   Yes Historical Provider, MD  Fluticasone-Salmeterol (ADVAIR) 250-50 MCG/DOSE AEPB Inhale 1 puff into  the lungs every 12 (twelve) hours.   Yes Historical Provider, MD  furosemide (LASIX) 20 MG tablet Take 20 mg by mouth daily as needed. For leg swelling   Yes Historical Provider, MD  hydrochlorothiazide (HYDRODIURIL) 25 MG tablet Take 25 mg by mouth daily.   Yes Historical Provider, MD  hydrocortisone (CORTEF) 10 MG tablet Take 10-30 mg by mouth every 4 (four) hours as needed. For itching   Yes Historical Provider, MD  lisinopril (PRINIVIL,ZESTRIL) 10 MG tablet Take 10 mg by mouth daily.   Yes Historical Provider, MD  potassium chloride SA (K-DUR,KLOR-CON) 20 MEQ tablet Take 20 mEq by mouth daily.   Yes Historical Provider, MD   Physical Exam: Filed Vitals:   06/10/12 1256 06/10/12 1530 06/10/12 1731  BP: 152/64 164/78 190/61  Pulse: 53 56   Temp: 97.8 F (36.6 C)    TempSrc: Oral    Resp:  20   SpO2: 96% 98%      General:  Well-developed well-nourished in no acute cardiopulmonary distress.  Eyes: Pupils equal round and reactive to light and accommodation. Extraocular movements intact.  ENT: Oropharynx is clear, no lesions, no exudates.  Neck: Supple with no lymphadenopathy.  Cardiovascular: Regular rate rhythm no murmurs rubs or gallops. Lower extremity with 2+ bilateral lower extremity edema.  Respiratory: Clear to auscultation bilaterally. No wheezes, no crackles, no rhonchi.  Abdomen: Obese, soft, nontender, positive bowel sounds  Skin: No rashes or lesions noted  Musculoskeletal: 4/5 bilateral upper extremity strength. 4/5 bilateral lower extremity strength.  Psychiatric: Normal mood. Normal affect. Good insight. Good judgment.  Neurologic: Alert and oriented x3. Cranial nerves II through XII are grossly intact. No focal deficits.  Labs on Admission:  Basic Metabolic Panel:  Lab 06/10/12 4540  NA 138  K 4.0  CL 98  CO2 32  GLUCOSE 123*  BUN 21  CREATININE 0.80  CALCIUM 9.6  MG --  PHOS --   Liver Function Tests:  Lab 06/10/12 1233  AST 23  ALT 22    ALKPHOS 65  BILITOT 0.2*  PROT 6.3  ALBUMIN 3.0*   No results found for this basename: LIPASE:5,AMYLASE:5 in the last 168 hours No results found for this basename: AMMONIA:5 in the last 168 hours CBC:  Lab 06/10/12 1233  WBC 7.8  NEUTROABS 4.8  HGB 10.5*  HCT 33.2*  MCV 81.0  PLT 215   Cardiac Enzymes: No results found for this basename: CKTOTAL:5,CKMB:5,CKMBINDEX:5,TROPONINI:5 in the last 168 hours  BNP (last 3 results)  Basename 06/10/12 1233  PROBNP 118.8   CBG: No results found for this basename: GLUCAP:5 in the last 168 hours  Radiological Exams on Admission: Dg Chest 2 View  06/10/2012  *RADIOLOGY REPORT*  Clinical Data: Chest pain  CHEST - 2 VIEW  Comparison: None.  Findings: Cardiac enlargement without heart failure.  Negative for edema or effusion.  Negative for pneumonia.  No mass lesion is identified.  IMPRESSION: Cardiac enlargement without acute cardiopulmonary disease.   Original Report Authenticated By: Janeece Riggers, M.D.     EKG: Independently reviewed. Sinus bradycardia  Assessment/Plan Principal Problem:  *Chest pain Active Problems:  Bilateral edema of lower extremity  HTN (hypertension)  Anemia  GERD (gastroesophageal reflux disease)  Depression  Hepatitis-C  COPD (chronic obstructive pulmonary disease)  Gout  Asthma  UTI (lower urinary  tract infection)  Bradycardia   #1 chest tightness/chest pain Questionable etiology. May be secondary to GI symptoms versus cardiac versus respiratory. We'll cycle cardiac enzymes Q6 hours x3. Place on telemetry bed. Check a 2-D echo. We'll place on scheduled nebulizer treatments. Place on a PPI. Follow.  #2 hypertension Patient states did not take her clonidine today. Patient however states that the other medications. Will give patient a dose of her clonidine orally times one now. We'll also place him hydralazine 10 mg IV every 6 hours when necessary systolic blood pressure greater than 180. Continue home  regimen. Follow.  #3 bilateral lower extremity edema Questionable etiology. Patient's LFTs are within normal limits. Patient however does have a history of prior hepatitis C status post treatment. Will check an abdominal ultrasound to evaluate the liver. Basic metabolic profile with a normal renal renal function doubt if patient does have a nephrotic syndrome. Urinalysis is negative for proteinuria. Check a TSH. We'll cycle cardiac enzymes every 6 hours x3. Check a 2-D echo to rule out systolic dysfunction. We'll give a dose of Lasix 60 mg IV x4. Follow.  #4 depression Resume home dose Lexapro.  #5 urinary tract infection Urine cultures are pending. Place on IV Rocephin.  #6 sinus bradycardia Stable. We'll cycle cardiac enzymes every 6 hours x3. 2-D echo is pending. Check a TSH. Patient is not on a rate limiting medication such as beta blocker or calcium channel blocker. Follow on telemetry.  #7 COPD Stable. Continue home regimen of Advair and Symbicort. We'll place on scheduled meds and nebs as needed.  #8 gout Stable.  #9 gastroesophageal reflux disease We'll place on a PPI. GI cocktail as needed.  #10 anemia Check an anemia panel. No overt GI bleed. Follow H&H. Continue home dose iron supplementations.  #11 prophylaxis PPI for GI prophylaxis Lovenox for DVT prophylaxis    Code Status: Full Family Communication: Updated patient at bedside. No family present. Disposition Plan: Admit to telemetry  Time spent: 70 mins  [facility] Pager [phone]  If 7PM-7AM, please contact night-coverage [email or web address] Password [facility] 06/10/2012, 6:05 PM

## 2012-06-10 NOTE — ED Notes (Signed)
MD at bedside; admitting MD

## 2012-06-10 NOTE — ED Notes (Signed)
Patient transported to X-ray 

## 2012-06-10 NOTE — ED Provider Notes (Signed)
History     CSN: [id number]  Arrival date & time 06/10/12  1252   First MD Initiated Contact with Patient 06/10/12 1258      Chief Complaint  Patient presents with  . Chest Pain    (Consider location/radiation/quality/duration/timing/severity/associated sxs/prior treatment) HPI Comments: Patient is a 78 year old female with a past medical history of COPD, CHF, obesity, and HTN who presents with chest pain that started 3 days ago. The pain is characterized as a chest tightness that is located in her central chest and does not radiate. The tightness is constant and associated with SOB. Patient also reports a 3 day history of increasing bilateral leg swelling and pain despite being compliant with her medications. No aggravating/allevaiting factors. Patient denies fever, headache, visual changes, nausea, vomiting, diarrhea, diaphoresis, abdominal pain, numbness/tingling.     Past Medical History  Diagnosis Date  . Hypertension   . COPD (chronic obstructive pulmonary disease)     History reviewed. No pertinent past surgical history.  History reviewed. No pertinent family history.  History  Substance Use Topics  . Smoking status: Not on file  . Smokeless tobacco: Not on file  . Alcohol Use:     OB History    Grav Para Term Preterm Abortions TAB SAB Ect Mult Living                  Review of Systems  Respiratory: Positive for chest tightness and shortness of breath.   Cardiovascular: Positive for leg swelling.  All other systems reviewed and are negative.    Allergies  Celebrex and Sulfa antibiotics  Home Medications   Current Outpatient Rx  Name  Route  Sig  Dispense  Refill  . CLOBETASOL PROPIONATE 0.05 % EX CREA   Topical   Apply 1 application topically 2 (two) times daily.         Marland Kitchen CLONIDINE HCL 0.1 MG PO TABS   Oral   Take 0.1 mg by mouth 2 (two) times daily.         Marland Kitchen ESCITALOPRAM OXALATE 10 MG PO TABS   Oral   Take 10 mg by mouth daily.           Marland Kitchen ESOMEPRAZOLE MAGNESIUM 40 MG PO CPDR   Oral   Take 40 mg by mouth daily before breakfast.         . FERROUS FUMARATE 325 (106 FE) MG PO TABS   Oral   Take 1 tablet by mouth 2 (two) times daily.         Marland Kitchen FLUTICASONE PROPIONATE 50 MCG/ACT NA SUSP   Nasal   Place 2 sprays into the nose daily.         Marland Kitchen FLUTICASONE-SALMETEROL 250-50 MCG/DOSE IN AEPB   Inhalation   Inhale 1 puff into the lungs every 12 (twelve) hours.         . FUROSEMIDE 20 MG PO TABS   Oral   Take 20 mg by mouth daily as needed. For leg swelling         . HYDROCHLOROTHIAZIDE 25 MG PO TABS   Oral   Take 25 mg by mouth daily.         Marland Kitchen HYDROCORTISONE 10 MG PO TABS   Oral   Take 10-30 mg by mouth every 4 (four) hours as needed. For itching         . LISINOPRIL 10 MG PO TABS   Oral   Take 10 mg by mouth daily.         Marland Kitchen  POTASSIUM CHLORIDE CRYS ER 20 MEQ PO TBCR   Oral   Take 20 mEq by mouth daily.           BP 152/64  Pulse 53  Temp 97.8 F (36.6 C) (Oral)  SpO2 96%  Physical Exam  Nursing note and vitals reviewed. Constitutional: She is oriented to person, place, and time. She appears well-developed and well-nourished. No distress.  HENT:  Head: Normocephalic and atraumatic.  Mouth/Throat: Oropharynx is clear and moist. No oropharyngeal exudate.  Eyes: Conjunctivae normal and EOM are normal. Pupils are equal, round, and reactive to light.  Neck: Normal range of motion. Neck supple.  Cardiovascular: Normal rate, regular rhythm and intact distal pulses.  Exam reveals no gallop and no friction rub.   No murmur heard.      3+ pitting edema of bilateral lower extremities.   Pulmonary/Chest: Effort normal and breath sounds normal. She has no wheezes. She has no rales. She exhibits no tenderness.  Abdominal: Soft. She exhibits no distension. There is no tenderness. There is no rebound and no guarding.  Musculoskeletal: Normal range of motion.  Neurological: She is alert and  oriented to person, place, and time. Coordination normal.       Speech is goal-oriented. Moves limbs without ataxia.   Skin: Skin is warm and dry. She is not diaphoretic.  Psychiatric: She has a normal mood and affect. Her behavior is normal.    ED Course  Procedures (including critical care time)   Date: 06/10/2012  Rate: 46  Rhythm: sinus bradycardia  QRS Axis: normal  Intervals: normal  ST/T Wave abnormalities: nonspecific ST/T changes  Conduction Disutrbances:none  Narrative Interpretation: sinus bradycardia unchanged from previous  Old EKG Reviewed: unchanged    Labs Reviewed - No data to display Dg Chest 2 View  06/10/2012  *RADIOLOGY REPORT*  Clinical Data: Chest pain  CHEST - 2 VIEW  Comparison: None.  Findings: Cardiac enlargement without heart failure.  Negative for edema or effusion.  Negative for pneumonia.  No mass lesion is identified.  IMPRESSION: Cardiac enlargement without acute cardiopulmonary disease.   Original Report Authenticated By: Janeece Riggers, M.D.      1. Chest pain       MDM  1:28 PM Basic labs pending, troponin and BNP pending. Chest xray ordered.    4:09 PM  Patient admitted to hospitalist for chest pain rule out.       Emilia Beck, PA-C 06/10/12 1612

## 2012-06-11 ENCOUNTER — Observation Stay (HOSPITAL_COMMUNITY): Payer: Medicare Other

## 2012-06-11 LAB — CK TOTAL AND CKMB (NOT AT ARMC)
CK, MB: 5.8 ng/mL — ABNORMAL HIGH (ref 0.3–4.0)
CK, MB: 6.4 ng/mL (ref 0.3–4.0)
CK, MB: 6.2 ng/mL (ref 0.3–4.0)
CK, MB: 5.5 ng/mL — ABNORMAL HIGH (ref 0.3–4.0)
Relative Index: 3.2 — ABNORMAL HIGH (ref 0.0–2.5)
Relative Index: 3 — ABNORMAL HIGH (ref 0.0–2.5)
Relative Index: 3 — ABNORMAL HIGH (ref 0.0–2.5)
Total CK: 182 U/L — ABNORMAL HIGH (ref 7–177)
Total CK: 210 U/L — ABNORMAL HIGH (ref 7–177)

## 2012-06-11 LAB — CBC
MCH: 25.9 pg — ABNORMAL LOW (ref 26.0–34.0)
MCHC: 32.4 g/dL (ref 30.0–36.0)
MCV: 80 fL (ref 78.0–100.0)
Platelets: 190 10*3/uL (ref 150–400)
RBC: 4.09 MIL/uL (ref 3.87–5.11)
RDW: 17.9 % — ABNORMAL HIGH (ref 11.5–15.5)

## 2012-06-11 LAB — BASIC METABOLIC PANEL
CO2: 28 mEq/L (ref 19–32)
Calcium: 9 mg/dL (ref 8.4–10.5)
Creatinine, Ser: 0.74 mg/dL (ref 0.50–1.10)
GFR calc non Af Amer: 81 mL/min — ABNORMAL LOW (ref >90)
Glucose, Bld: 140 mg/dL — ABNORMAL HIGH (ref 70–99)
Sodium: 138 mEq/L (ref 135–145)

## 2012-06-11 LAB — FERRITIN: Ferritin: 52 ng/mL (ref 10–291)

## 2012-06-11 LAB — TSH: TSH: 2.324 u[IU]/mL (ref 0.350–4.500)

## 2012-06-11 LAB — IRON AND TIBC
Iron: 217 ug/dL — ABNORMAL HIGH (ref 42–135)
UIBC: 204 ug/dL (ref 125–400)

## 2012-06-11 MED ORDER — FUROSEMIDE 80 MG PO TABS
80.0000 mg | ORAL_TABLET | Freq: Two times a day (BID) | ORAL | Status: DC
  Administered 2012-06-11: 80 mg via ORAL
  Filled 2012-06-11 (×2): qty 1

## 2012-06-11 MED ORDER — CARVEDILOL 3.125 MG PO TABS
3.1250 mg | ORAL_TABLET | Freq: Two times a day (BID) | ORAL | Status: DC
  Administered 2012-06-11 – 2012-06-12 (×4): 3.125 mg via ORAL
  Filled 2012-06-11 (×6): qty 1

## 2012-06-11 MED ORDER — ISOSORBIDE MONONITRATE 15 MG HALF TABLET
15.0000 mg | ORAL_TABLET | Freq: Every day | ORAL | Status: DC
  Administered 2012-06-12: 15 mg via ORAL
  Filled 2012-06-11: qty 1

## 2012-06-11 MED ORDER — CLONIDINE HCL 0.1 MG PO TABS
0.1000 mg | ORAL_TABLET | Freq: Every day | ORAL | Status: DC
  Administered 2012-06-12: 0.1 mg via ORAL
  Filled 2012-06-11: qty 1

## 2012-06-11 MED ORDER — HYDROCHLOROTHIAZIDE 12.5 MG PO CAPS
12.5000 mg | ORAL_CAPSULE | Freq: Every day | ORAL | Status: DC
  Administered 2012-06-12: 12.5 mg via ORAL
  Filled 2012-06-11: qty 1

## 2012-06-11 NOTE — Progress Notes (Signed)
CRITICAL VALUE ALERT  Critical value received:  CKMB 6.3  Date of notification:  06/10/12  Time of notification:  2100  Critical value read back: yes  Nurse who received alert:  Lenna Gilford, RN  MD notified (1st page):  Craige Cotta, NP  Time of first page:  2115  MD notified (2nd page): N/A  Time of second page: N/A  Responding MD:  Craige Cotta, NP  Time MD responded:  2120

## 2012-06-11 NOTE — Progress Notes (Signed)
Blood pressure 190/80  Manual 10mg  hydralazine IV given pt asymptomatic will continue to monitor. Ilean Skill LPN

## 2012-06-11 NOTE — ED Provider Notes (Signed)
Medical screening examination/treatment/procedure(s) were conducted as a shared visit with non-physician practitioner(s) and myself.  I personally evaluated the patient during the encounter.  Uncertain etiology of chest pain. Moderate risk factor profile. Admit.  Donnetta Hutching, MD 06/11/12 1515

## 2012-06-11 NOTE — Care Management Note (Signed)
    Page 1 of 1   06/12/2012     12:16:03 PM   CARE MANAGEMENT NOTE 06/12/2012  Patient:  Monique Lewis, Monique Lewis   Account Number:  [id number]  Date Initiated:  06/11/2012  Documentation initiated by:  GRAVES-BIGELOW,Rayelle Armor  Subjective/Objective Assessment:   Pt admitted with cp and lower extremity swelling.     Action/Plan:   CM will continue to monitor for disposition needs.   Anticipated DC Date:  06/12/2012   Anticipated DC Plan:  HOME/SELF CARE      DC Planning Services  CM consult      Choice offered to / List presented to:          [facility] arranged  HH-1 RN  HH-10 DISEASE MANAGEMENT      HH agency  [facility]   Status of service:  Completed, signed off Medicare Important Message given?   (If response is "NO", the following Medicare IM given date fields will be blank) Date Medicare IM given:   Date Additional Medicare IM given:    Discharge Disposition:  HOME W HOME HEALTH SERVICES  Per UR Regulation:  Reviewed for med. necessity/level of care/duration of stay  If discussed at Long Length of Stay Meetings, dates discussed:    Comments:  06-12-12 1215 Tomi Bamberger, RN,BSN [phone] Referral made with [facility] for [facility] services listed above for CHF management. SOC to begin within 72 hours of d/c.

## 2012-06-11 NOTE — Progress Notes (Signed)
PROGRESS NOTE  Monique Lewis JXB:[id number] DOB: 06-Jul-1933 DOA: 06/10/2012 PCP: Dorrene German, MD  Brief narrative: 78 yr old female admit 11.12.13 with LE swelling and Chest discomfort that started about 1 week ago but worsened progressively over the 3 days prior to admission. She describes it more as a pressure. It was intermittent and then became constant. She states that she is trial of Nexium around-the-clock and that this did not help. She is on a home dose of Lasix 20 mg every day as needed however this was not effective in reducing her lower strength is swelling. Her lower strip swelling is worse on the right side than the left because of 5 recurrent knee surgeries  Past medical history-As per Problem list Chart reviewed as below-  Admit 12.22.06 with chronic Cholecystitis  Seen by Dr. Jacqualine Mau for Type1 a Hepatitis C  Consultants:  Cardiology-Dr. Algie Coffer  Procedures:  Ultrasound liver neg  Echo 06/11/12 pending  Antibiotics:  None currently   Subjective  Denies chest pain at present. Wants to eat. No nausea no vomiting no shortness of breath Ambulated with physical therapy and did not need any further assistance. States that lower strip swelling is better. States has no pressure in the chest either.   Objective    Interim History: Noted elevated cardiac enzymes with elevated troponin  Telemetry: Sinus rhythm 60 to 70s with no red alarms-telemetry discontinued 06/11/2029  Objective: Filed Vitals:   06/11/12 0257 06/11/12 0613 06/11/12 0627 06/11/12 0732  BP:  190/75 190/80 184/80  Pulse:  56  67  Temp:  97.7 F (36.5 C)    TempSrc:  Oral    Resp:  16    Height:      Weight:  97.523 kg (215 lb)    SpO2: 99% 99%      Intake/Output Summary (Last 24 hours) at 06/11/12 1027 Last data filed at 06/11/12 0700  Gross per 24 hour  Intake      0 ml  Output    400 ml  Net   -400 ml    Exam:  General: Alert pleasant African American female looking  about stated age, Arcus present Cardiovascular: S1-S2 no murmur rub or gallop, no bruit Respiratory: Clinically clear with no added sound Abdomen: Of nontender nondistended no rebound Skin grade 2-3 lower extremity edema, slightly elevated and increased right more than left leg Neuro grossly intact  Data Reviewed: Basic Metabolic Panel:  Lab 06/11/12 1308 06/10/12 2055 06/10/12 1233  NA 138 -- 138  K 3.7 -- 4.0  CL 99 -- 98  CO2 28 -- 32  GLUCOSE 140* -- 123*  BUN 19 -- 21  CREATININE 0.74 0.72 0.80  CALCIUM 9.0 -- 9.6  MG -- 1.9 --  PHOS -- -- --   Liver Function Tests:  Lab 06/10/12 1233  AST 23  ALT 22  ALKPHOS 65  BILITOT 0.2*  PROT 6.3  ALBUMIN 3.0*    Lab 06/10/12 2055  LIPASE 25  AMYLASE --   No results found for this basename: AMMONIA:5 in the last 168 hours CBC:  Lab 06/11/12 0224 06/10/12 2055 06/10/12 1233  WBC 8.9 8.4 7.8  NEUTROABS -- -- 4.8  HGB 10.6* 12.6 10.5*  HCT 32.7* 38.6 33.2*  MCV 80.0 82.1 81.0  PLT 190 177 215   Cardiac Enzymes:  Lab 06/11/12 0817 06/11/12 0224 06/10/12 2055  CKTOTAL 210* 182* 196*  CKMB 6.4* 5.8* 6.3*  CKMBINDEX -- -- --  TROPONINI <0.30 <0.30 <0.30  BNP: No components found with this basename: POCBNP:5 CBG: No results found for this basename: GLUCAP:5 in the last 168 hours  No results found for this or any previous visit (from the past 240 hour(s)).   Studies:              All Imaging reviewed and is as per above notation   Scheduled Meds:    . albuterol  2.5 mg Nebulization Q6H  . aspirin EC  81 mg Oral Daily  . cefTRIAXone (ROCEPHIN)  IV  1 g Intravenous Q24H  . clobetasol cream  1 application Topical BID  . cloNIDine  0.1 mg Oral BID  . enoxaparin (LOVENOX) injection  40 mg Subcutaneous Q24H  . escitalopram  10 mg Oral Daily  . ferrous fumarate  1 tablet Oral BID  . fluticasone  2 spray Each Nare Daily  . furosemide  80 mg Oral BID  . [EXPIRED] hydrALAZINE      . hydrochlorothiazide  25 mg  Oral Daily  . [COMPLETED] influenza  inactive virus vaccine  0.5 mL Intramuscular Tomorrow-1000  . ipratropium  0.5 mg Nebulization Q6H  . lisinopril  10 mg Oral Daily  . mometasone-formoterol  2 puff Inhalation BID  . pantoprazole  40 mg Oral Q0600  . potassium chloride SA  20 mEq Oral Daily  . sodium chloride  3 mL Intravenous Q12H  . [DISCONTINUED] aspirin EC  81 mg Oral Daily  . [DISCONTINUED] cloNIDine  0.1 mg Oral BID  . [DISCONTINUED] furosemide  60 mg Intravenous Q12H  . [DISCONTINUED] heparin  5,000 Units Subcutaneous Q8H  . [DISCONTINUED] sodium chloride  3 mL Intravenous Q12H  . [DISCONTINUED] sodium chloride  3 mL Intravenous Q12H   Continuous Infusions:    Assessment/Plan: #1 chest tightness/chest pain-potentially 2/2 to Decompensated CHF>Liver dysfucntion Questionable etiology. May be secondary to GI symptoms versus cardiac versus respiratory. cardiac enzymes show elevation of CK and CK-MB fractions. Troponin was negative however-I believe this may be secondary to decompensated CHF  a 2-D echo is still pending. We'll place on scheduled nebulizer treatments. Place on a PPI. Follow. BNP was 118 on admit so not overt ly CHF #2 hypertension  Continue clonidine 0.1 twice a day [wean to once daily], HCTZ 25 daily, lisinopril 10 mg daily, she will need addition of a beta blocker if she does turn out to have wall motion abnormalities on echocardiogram. I will implement cautiously Coreg 3.125 with hold parameters #3 bilateral lower extremity edema   Patient's LFTs are within normal limits. Patient however does have a history of prior hepatitis C status post treatment.  abdominal ultrasound to evaluate the liver was negative 06/11/2012. Urinalysis is negative for proteinuria. Check a TSH.  #4 depression  Resume home dose Lexapro 10 mg daily  #5 urinary tract infection  Urine cultures are pending. Place on IV Rocephin 1 g every 24 hourly-will DC antibiotics if cultures turn out to be  negative #6 sinus bradycardia  Stable. We'll cycle cardiac enzymes every 6 hours x3. 2-D echo is pending. Check a TSH. Patient is not on a rate limiting medication such as beta blocker or calcium channel blocker. Follow on telemetry.  #7 COPD  Stable. Continue home regimen of Advair and Symbicort. We'll place on scheduled meds and nebs as needed.  #8 gout  Stable.  #9 gastroesophageal reflux disease  We'll place on a PPI. GI cocktail as needed.  #10 anemia-microcytic Check an anemia panel. No overt GI bleed. Follow H&H. Continue home  dose iron supplementations. Black stool #11 prophylaxis  PPI for GI prophylaxis Lovenox for DVT prophy   Code Status: Full  Family Communication: Fully updated patient Disposition Plan: [facility] Card[facility]   Pleas Koch, MD  [facility] Pager [phone] 06/11/2012, 10:27 AM    LOS: 1 day

## 2012-06-11 NOTE — Progress Notes (Signed)
Blood pressure rechecked 184/80 hr 67. Ilean Skill LPN

## 2012-06-11 NOTE — Progress Notes (Signed)
Occupational Therapy Evaluation Patient Details Name: Monique Lewis MRN: [id number] DOB: [birth year] Today's Date: 06/11/2012 Time: 4098-1191 OT Time Calculation (min): 25 min  OT Assessment / Plan / Recommendation Clinical Impression  78 yo admitted due to chest tightness and LE edema. Pt with CHF. Pt will benefit from skilled OT services to max independence with ADL and functional moibilty for ADL to facilitate safe D/C home. Pt has PCA 5 days/wk and appears safe to D/C home.     OT Assessment  Patient needs continued OT Services    Follow Up Recommendations  No OT follow up    Barriers to Discharge None    Equipment Recommendations  3 in 1 bedside comode    Recommendations for Other Services  none  Frequency  Min 2X/week    Precautions / Restrictions Precautions Precautions: Fall   Pertinent Vitals/Pain No pain    ADL  Grooming: Modified independent Where Assessed - Grooming: Unsupported standing Upper Body Bathing: Supervision/safety;Set up Where Assessed - Upper Body Bathing: Unsupported sitting Lower Body Bathing: Minimal assistance Where Assessed - Lower Body Bathing: Unsupported sit to stand Upper Body Dressing: Set up;Supervision/safety Where Assessed - Upper Body Dressing: Unsupported sitting Lower Body Dressing: Minimal assistance Where Assessed - Lower Body Dressing: Unsupported sit to stand Toilet Transfer: Min guard Toilet Transfer Method: Sit to Barista: Bedside commode Toileting - Clothing Manipulation and Hygiene: Modified independent Where Assessed - Engineer, mining and Hygiene: Sit to stand from 3-in-1 or toilet Tub/Shower Transfer: Other (comment) Equipment Used: Gait belt Transfers/Ambulation Related to ADLs: S ADL Comments: May benefit from AE for LB D    OT Diagnosis: Generalized weakness  OT Problem List: Decreased activity tolerance;Decreased knowledge of use of DME or AE;Obesity;Increased edema OT  Treatment Interventions: Self-care/ADL training;Energy conservation;DME and/or AE instruction;Patient/family education   OT Goals Acute Rehab OT Goals OT Goal Formulation: With patient Time For Goal Achievement: 06/25/12 Potential to Achieve Goals: Good ADL Goals Pt Will Perform Lower Body Bathing: with supervision;Sit to stand from chair;Unsupported;with adaptive equipment ADL Goal: Lower Body Bathing - Progress: Goal set today Pt Will Perform Lower Body Dressing: with supervision;Sit to stand from chair;Unsupported;with adaptive equipment;Other (comment) (demonstrating E conservation techniques) ADL Goal: Lower Body Dressing - Progress: Goal set today Pt Will Transfer to Toilet: with modified independence;with DME;3-in-1 ADL Goal: Toilet Transfer - Progress: Goal set today  Visit Information  Last OT Received On: 06/11/12    Subjective Data      Prior Functioning     Home Living Lives With: Alone Available Help at Discharge: Personal care attendant (3 hr/day 5 days) Type of Home: Apartment Home Access: Level entry;Elevator Home Layout: One level Bathroom Shower/Tub: Tub/shower unit;Curtain Firefighter: Standard Bathroom Accessibility: Yes How Accessible: Accessible via walker Home Adaptive Equipment: Straight cane;Walker - rolling;Wheelchair - powered;Grab bars around toilet;Hand-held shower hose;Shower chair with back Prior Function Level of Independence: Independent with assistive device(s);Needs assistance Needs Assistance: Bathing;Meal Prep;Light Housekeeping Bath: Minimal Meal Prep: Maximal Light Housekeeping: Maximal Able to Take Stairs?: No Driving: No Communication Communication: No difficulties Dominant Hand: Right         Vision/Perception Perception Perception: Within Functional Limits   Cognition  Overall Cognitive Status: Appears within functional limits for tasks assessed/performed Arousal/Alertness: Awake/alert Orientation Level: Appears  intact for tasks assessed Behavior During Session: [facility] for tasks performed    Extremity/Trunk Assessment Right Upper Extremity Assessment RUE ROM/Strength/Tone: [facility] for tasks assessed RUE Sensation: WFL - Light Touch;WFL - Proprioception RUE  Coordination: WFL - gross/fine motor Left Upper Extremity Assessment LUE ROM/Strength/Tone: WFL for tasks assessed LUE Sensation: WFL - Light Touch;WFL - Proprioception LUE Coordination: WFL - gross/fine motor Right Lower Extremity Assessment RLE ROM/Strength/Tone: Within functional levels Left Lower Extremity Assessment LLE ROM/Strength/Tone: WFL for tasks assessed     Mobility Bed Mobility Bed Mobility: Supine to Sit;Sitting - Scoot to Edge of Bed Supine to Sit: 5: Supervision;HOB flat;With rails Sitting - Scoot to Edge of Bed: 6: Modified independent (Device/Increase time) Transfers Transfers: Sit to Stand;Stand to Sit Sit to Stand: 5: Supervision;With upper extremity assist;From bed Stand to Sit: 5: Supervision;With upper extremity assist;To chair/3-in-1 Details for Transfer Assistance: safe technique     Shoulder Instructions     Exercise     Balance  WFL   End of Session OT - End of Session Equipment Utilized During Treatment: Gait belt Activity Tolerance: Patient tolerated treatment well Patient left: in chair;with call bell/phone within reach Nurse Communication: Mobility status  GO Functional Assessment Tool Used: clinical judgement Functional Limitation: Self care Self Care Current Status (Z6109): At least 1 percent but less than 20 percent impaired, limited or restricted Self Care Goal Status (U0454): 0 percent impaired, limited or restricted Self Care Discharge Status [phone]): 0 percent impaired, limited or restricted   Margurite Duffy,HILLARY 06/11/2012, 3:02 PM [facility], OTR/L  [phone] 06/11/2012

## 2012-06-11 NOTE — Evaluation (Signed)
Physical Therapy Evaluation Patient Details Name: Monique Lewis MRN: [id number] DOB: Jun 06, 1933 Today's Date: 06/11/2012 Time: 4098-1191 PT Time Calculation (min): 33 min  PT Assessment / Plan / Recommendation Clinical Impression  pt admitted with bil LE swelling with potential HF and UTI.  Mobility near baseline, but with decr activity tolerance.  Will see on acute.    PT Assessment  Patient needs continued PT services    Follow Up Recommendations  No PT follow up    Does the patient have the potential to tolerate intense rehabilitation      Barriers to Discharge        Equipment Recommendations  None recommended by PT    Recommendations for Other Services     Frequency Min 3X/week    Precautions / Restrictions Precautions Precautions: Fall (low risk to fall)   Pertinent Vitals/Pain       Mobility  Bed Mobility Bed Mobility: Supine to Sit;Sitting - Scoot to Edge of Bed Supine to Sit: 5: Supervision;HOB elevated Sitting - Scoot to Edge of Bed: 5: Supervision Details for Bed Mobility Assistance: safe and efficient technique Transfers Transfers: Sit to Stand;Stand to Sit Sit to Stand: With upper extremity assist;From bed;From toilet;5: Supervision Stand to Sit: With upper extremity assist;To chair/3-in-1;To toilet;5: Supervision Details for Transfer Assistance: safe technique Ambulation/Gait Ambulation/Gait Assistance: 4: Min guard Ambulation Distance (Feet): 140 Feet Assistive device: None;Other (Comment) (occaisional use of the rail) Ambulation/Gait Assistance Details: mildly unsteady with extra lateral w/shift which might be moderated with a cane Gait Pattern: Step-through pattern;Decreased step length - right;Decreased step length - left;Decreased stride length Stairs: No    Shoulder Instructions     Exercises     PT Diagnosis: Generalized weakness;Other (comment) (decr act. tolerance)  PT Problem List: Decreased strength;Decreased activity  tolerance;Pain PT Treatment Interventions: DME instruction;Gait training;Functional mobility training;Therapeutic activities;Patient/family education   PT Goals Acute Rehab PT Goals PT Goal Formulation: With patient Time For Goal Achievement: 06/18/12 Potential to Achieve Goals: Good Pt will go Supine/Side to Sit: Independently PT Goal: Supine/Side to Sit - Progress: Goal set today Pt will go Sit to Stand: with modified independence PT Goal: Sit to Stand - Progress: Goal set today Pt will Transfer Bed to Chair/Chair to Bed: with modified independence PT Transfer Goal: Bed to Chair/Chair to Bed - Progress: Goal set today Pt will Ambulate: >150 feet;with supervision;with least restrictive assistive device PT Goal: Ambulate - Progress: Goal set today  Visit Information  Last PT Received On: 06/11/12 Assistance Needed: +1    Subjective Data  Subjective: I've been having sciatica problems for about 5 weeks Patient Stated Goal: Back home   Prior Functioning  Home Living Lives With: Alone Available Help at Discharge: Personal care attendant (3 hr/day 5 days) Type of Home: Apartment Home Access: Level entry;Elevator Home Layout: One level Bathroom Shower/Tub: Tub/shower unit;Curtain Firefighter: Standard Home Adaptive Equipment: Straight cane;Walker - rolling;Wheelchair - powered;Grab bars around toilet;Hand-held shower hose Prior Function Level of Independence: Independent with assistive device(s);Needs assistance Able to Take Stairs?: No Driving: No Communication Communication: No difficulties Dominant Hand: Right    Cognition  Overall Cognitive Status: Appears within functional limits for tasks assessed/performed Arousal/Alertness: Awake/alert Orientation Level: Appears intact for tasks assessed Behavior During Session: [name] for tasks performed    Extremity/Trunk Assessment Right Lower Extremity Assessment RLE ROM/Strength/Tone: Within functional levels (painful and  slightly weaker than L  due to recent problems) Left Lower Extremity Assessment LLE ROM/Strength/Tone: Within functional levels   Balance Balance  Balance Assessed: No  End of Session PT - End of Session Activity Tolerance: Patient tolerated treatment well Patient left: in chair;with call bell/phone within reach Nurse Communication: Mobility status  GP Functional Assessment Tool Used: clinical judgement Functional Limitation: Mobility: Walking and moving around Mobility: Walking and Moving Around Current Status (W0981): At least 1 percent but less than 20 percent impaired, limited or restricted Mobility: Walking and Moving Around Goal Status [phone]): 0 percent impaired, limited or restricted   Monique Lewis, Eliseo Gum 06/11/2012, 10:31 AM  06/11/2012  Vass Bing, PT [phone] (pager)

## 2012-06-11 NOTE — Consult Note (Signed)
Monique Lewis is an 78 y.o. female.   Chief Complaint: Chest pressure and leg edema HPI: 78 years old female with long standing history of heart burn has 1 week history of chest pressure not responding to Nexium as usual along with exertional dyspnea and leg edema. Quick look echocardiogram showed LVH with preserved LV systolic function suggesting hypertensive heart disease causing exertional dyspnea.    Past Medical History  Diagnosis Date  . Hypertension   . COPD (chronic obstructive pulmonary disease)   . HTN (hypertension) 06/10/2012  . GERD (gastroesophageal reflux disease) 06/10/2012  . Depression 06/10/2012  . Hepatitis-C 06/10/2012    Hx of Hep C after knee surgery 2001 s/p treatment per patient.  . Gout 06/10/2012  . Asthma 06/10/2012  . CHF (congestive heart failure)   . Heart murmur   . Anginal pain   . Pneumonia ~ 2001  . Exertional dyspnea   . Iron deficiency anemia 06/10/2012  . History of blood transfusion     "several; first one I caught Hepatitis" (06/10/12)  . Headache     "severe last 3 wk" (06/10/12)  . Arthritis     "all over my body" (06/10/12)  . Chronic lower back pain   . Recurrent UTI (urinary tract infection)     "get them maybe q 6 months" (06/10/12)      Past Surgical History  Procedure Date  . Cholecystectomy 2006  . Tonsillectomy and adenoidectomy 1940's  . Dilation and curettage of uterus   . Joint replacement     s/p 5 knee replacements  . Total knee arthroplasty 2001 - 2011    "5 total; have had them done on both knees" (06/10/12)    History reviewed. No pertinent family history. Social History:  reports that she has never smoked. She has never used smokeless tobacco. She reports that she does not drink alcohol or use illicit drugs.  Allergies:  Allergies  Allergen Reactions  . Celebrex (Celecoxib) Itching  . Sulfa Antibiotics Itching    Medications Prior to Admission  Medication Sig Dispense Refill  . clobetasol cream  (TEMOVATE) 0.05 % Apply 1 application topically 2 (two) times daily.      . cloNIDine (CATAPRES) 0.1 MG tablet Take 0.1 mg by mouth 2 (two) times daily.      Marland Kitchen escitalopram (LEXAPRO) 10 MG tablet Take 10 mg by mouth daily.      Marland Kitchen esomeprazole (NEXIUM) 40 MG capsule Take 40 mg by mouth daily before breakfast.      . ferrous fumarate (HEMOCYTE - 106 MG FE) 325 (106 FE) MG TABS Take 1 tablet by mouth 2 (two) times daily.      . fluticasone (FLONASE) 50 MCG/ACT nasal spray Place 2 sprays into the nose daily.      . Fluticasone-Salmeterol (ADVAIR) 250-50 MCG/DOSE AEPB Inhale 1 puff into the lungs every 12 (twelve) hours.      . furosemide (LASIX) 20 MG tablet Take 20 mg by mouth daily as needed. For leg swelling      . hydrochlorothiazide (HYDRODIURIL) 25 MG tablet Take 25 mg by mouth daily.      . hydrocortisone (CORTEF) 10 MG tablet Take 10-30 mg by mouth every 4 (four) hours as needed. For itching      . lisinopril (PRINIVIL,ZESTRIL) 10 MG tablet Take 10 mg by mouth daily.      . potassium chloride SA (K-DUR,KLOR-CON) 20 MEQ tablet Take 20 mEq by mouth daily.  Results for orders placed during the hospital encounter of 06/10/12 (from the past 48 hour(s))  CBC WITH DIFFERENTIAL     Status: Abnormal   Collection Time   06/10/12 12:33 PM      Component Value Range Comment   WBC 7.8  4.0 - 10.5 K/uL    RBC 4.10  3.87 - 5.11 MIL/uL    Hemoglobin 10.5 (*) 12.0 - 15.0 g/dL    HCT 16.1 (*) 09.6 - 46.0 %    MCV 81.0  78.0 - 100.0 fL    MCH 25.6 (*) 26.0 - 34.0 pg    MCHC 31.6  30.0 - 36.0 g/dL    RDW 04.5 (*) 40.9 - 15.5 %    Platelets 215  150 - 400 K/uL    Neutrophils Relative 62  43 - 77 %    Neutro Abs 4.8  1.7 - 7.7 K/uL    Lymphocytes Relative 27  12 - 46 %    Lymphs Abs 2.1  0.7 - 4.0 K/uL    Monocytes Relative 10  3 - 12 %    Monocytes Absolute 0.8  0.1 - 1.0 K/uL    Eosinophils Relative 1  0 - 5 %    Eosinophils Absolute 0.1  0.0 - 0.7 K/uL    Basophils Relative 0  0 - 1 %     Basophils Absolute 0.0  0.0 - 0.1 K/uL   COMPREHENSIVE METABOLIC PANEL     Status: Abnormal   Collection Time   06/10/12 12:33 PM      Component Value Range Comment   Sodium 138  135 - 145 mEq/L    Potassium 4.0  3.5 - 5.1 mEq/L    Chloride 98  96 - 112 mEq/L    CO2 32  19 - 32 mEq/L    Glucose, Bld 123 (*) 70 - 99 mg/dL    BUN 21  6 - 23 mg/dL    Creatinine, Ser 8.11  0.50 - 1.10 mg/dL    Calcium 9.6  8.4 - 91.4 mg/dL    Total Protein 6.3  6.0 - 8.3 g/dL    Albumin 3.0 (*) 3.5 - 5.2 g/dL    AST 23  0 - 37 U/L    ALT 22  0 - 35 U/L    Alkaline Phosphatase 65  39 - 117 U/L    Total Bilirubin 0.2 (*) 0.3 - 1.2 mg/dL    GFR calc non Af Amer 70 (*) >90 mL/min    GFR calc Af Amer 81 (*) >90 mL/min   PRO B NATRIURETIC PEPTIDE     Status: Normal   Collection Time   06/10/12 12:33 PM      Component Value Range Comment   Pro B Natriuretic peptide (BNP) 118.8  0 - 450 pg/mL   POCT I-STAT TROPONIN I     Status: Normal   Collection Time   06/10/12  1:49 PM      Component Value Range Comment   Troponin i, poc 0.01  0.00 - 0.08 ng/mL    Comment 3            URINALYSIS, ROUTINE W REFLEX MICROSCOPIC     Status: Abnormal   Collection Time   06/10/12  3:21 PM      Component Value Range Comment   Color, Urine YELLOW  YELLOW    APPearance HAZY (*) CLEAR    Specific Gravity, Urine 1.012  1.005 - 1.030    pH 6.5  5.0 - 8.0    Glucose, UA NEGATIVE  NEGATIVE mg/dL    Hgb urine dipstick NEGATIVE  NEGATIVE    Bilirubin Urine NEGATIVE  NEGATIVE    Ketones, ur NEGATIVE  NEGATIVE mg/dL    Protein, ur NEGATIVE  NEGATIVE mg/dL    Urobilinogen, UA 0.2  0.0 - 1.0 mg/dL    Nitrite NEGATIVE  NEGATIVE    Leukocytes, UA MODERATE (*) NEGATIVE   URINE CULTURE     Status: Normal (Preliminary result)   Collection Time   06/10/12  3:21 PM      Component Value Range Comment   Specimen Description URINE, CLEAN CATCH      Special Requests NONE      Culture  Setup Time 06/10/2012 16:06      Colony Count  PENDING      Culture Culture reincubated for better growth      Report Status PENDING     URINE MICROSCOPIC-ADD ON     Status: Abnormal   Collection Time   06/10/12  3:21 PM      Component Value Range Comment   Squamous Epithelial / LPF FEW (*) RARE    WBC, UA 3-6  <3 WBC/hpf    RBC / HPF 0-2  <3 RBC/hpf    Bacteria, UA MANY (*) RARE   POCT I-STAT TROPONIN I     Status: Normal   Collection Time   06/10/12  4:33 PM      Component Value Range Comment   Troponin i, poc 0.01  0.00 - 0.08 ng/mL    Comment 3            CBC     Status: Abnormal   Collection Time   06/10/12  8:55 PM      Component Value Range Comment   WBC 8.4  4.0 - 10.5 K/uL    RBC 4.70  3.87 - 5.11 MIL/uL    Hemoglobin 12.6  12.0 - 15.0 g/dL    HCT 78.2  95.6 - 21.3 %    MCV 82.1  78.0 - 100.0 fL    MCH 26.8  26.0 - 34.0 pg    MCHC 32.6  30.0 - 36.0 g/dL    RDW 08.6 (*) 57.8 - 15.5 %    Platelets 177  150 - 400 K/uL PLATELET COUNT CONFIRMED BY SMEAR  CREATININE, SERUM     Status: Abnormal   Collection Time   06/10/12  8:55 PM      Component Value Range Comment   Creatinine, Ser 0.72  0.50 - 1.10 mg/dL    GFR calc non Af Amer 81 (*) >90 mL/min    GFR calc Af Amer >90  >90 mL/min   MAGNESIUM     Status: Normal   Collection Time   06/10/12  8:55 PM      Component Value Range Comment   Magnesium 1.9  1.5 - 2.5 mg/dL   TSH     Status: Normal   Collection Time   06/10/12  8:55 PM      Component Value Range Comment   TSH 2.324  0.350 - 4.500 uIU/mL   APTT     Status: Normal   Collection Time   06/10/12  8:55 PM      Component Value Range Comment   aPTT 32  24 - 37 seconds   PROTIME-INR     Status: Normal   Collection Time   06/10/12  8:55 PM  Component Value Range Comment   Prothrombin Time 12.8  11.6 - 15.2 seconds    INR 0.97  0.00 - 1.49   TROPONIN I     Status: Normal   Collection Time   06/10/12  8:55 PM      Component Value Range Comment   Troponin I <0.30  <0.30 ng/mL   CK TOTAL AND CKMB      Status: Abnormal   Collection Time   06/10/12  8:55 PM      Component Value Range Comment   Total CK 196 (*) 7 - 177 U/L    CK, MB 6.3 (*) 0.3 - 4.0 ng/mL    Relative Index 3.2 (*) 0.0 - 2.5   VITAMIN B12     Status: Normal   Collection Time   06/10/12  8:55 PM      Component Value Range Comment   Vitamin B-12 407  211 - 911 pg/mL   IRON AND TIBC     Status: Abnormal   Collection Time   06/10/12  8:55 PM      Component Value Range Comment   Iron 217 (*) 42 - 135 ug/dL    TIBC 295  621 - 308 ug/dL    Saturation Ratios 52  20 - 55 %    UIBC 204  125 - 400 ug/dL   FERRITIN     Status: Normal   Collection Time   06/10/12  8:55 PM      Component Value Range Comment   Ferritin 52  10 - 291 ng/mL   LIPASE, BLOOD     Status: Normal   Collection Time   06/10/12  8:55 PM      Component Value Range Comment   Lipase 25  11 - 59 U/L   BASIC METABOLIC PANEL     Status: Abnormal   Collection Time   06/11/12  2:24 AM      Component Value Range Comment   Sodium 138  135 - 145 mEq/L    Potassium 3.7  3.5 - 5.1 mEq/L    Chloride 99  96 - 112 mEq/L    CO2 28  19 - 32 mEq/L    Glucose, Bld 140 (*) 70 - 99 mg/dL    BUN 19  6 - 23 mg/dL    Creatinine, Ser 6.57  0.50 - 1.10 mg/dL    Calcium 9.0  8.4 - 84.6 mg/dL    GFR calc non Af Amer 81 (*) >90 mL/min    GFR calc Af Amer >90  >90 mL/min   CBC     Status: Abnormal   Collection Time   06/11/12  2:24 AM      Component Value Range Comment   WBC 8.9  4.0 - 10.5 K/uL    RBC 4.09  3.87 - 5.11 MIL/uL    Hemoglobin 10.6 (*) 12.0 - 15.0 g/dL    HCT 96.2 (*) 95.2 - 46.0 %    MCV 80.0  78.0 - 100.0 fL    MCH 25.9 (*) 26.0 - 34.0 pg    MCHC 32.4  30.0 - 36.0 g/dL    RDW 84.1 (*) 32.4 - 15.5 %    Platelets 190  150 - 400 K/uL   TROPONIN I     Status: Normal   Collection Time   06/11/12  2:24 AM      Component Value Range Comment   Troponin I <0.30  <0.30 ng/mL   CK TOTAL AND  CKMB     Status: Abnormal   Collection Time   06/11/12  2:24  AM      Component Value Range Comment   Total CK 182 (*) 7 - 177 U/L    CK, MB 5.8 (*) 0.3 - 4.0 ng/mL    Relative Index 3.2 (*) 0.0 - 2.5   TROPONIN I     Status: Normal   Collection Time   06/11/12  8:17 AM      Component Value Range Comment   Troponin I <0.30  <0.30 ng/mL   CK TOTAL AND CKMB     Status: Abnormal   Collection Time   06/11/12  8:17 AM      Component Value Range Comment   Total CK 210 (*) 7 - 177 U/L    CK, MB 6.4 (*) 0.3 - 4.0 ng/mL    Relative Index 3.0 (*) 0.0 - 2.5   CK TOTAL AND CKMB     Status: Abnormal   Collection Time   06/11/12  1:11 PM      Component Value Range Comment   Total CK 222 (*) 7 - 177 U/L    CK, MB 6.2 (*) 0.3 - 4.0 ng/mL CRITICAL VALUE NOTED.  VALUE IS CONSISTENT WITH PREVIOUSLY REPORTED AND CALLED VALUE.   Relative Index 2.8 (*) 0.0 - 2.5   CK TOTAL AND CKMB     Status: Abnormal   Collection Time   06/11/12  6:46 PM      Component Value Range Comment   Total CK 184 (*) 7 - 177 U/L    CK, MB 5.5 (*) 0.3 - 4.0 ng/mL    Relative Index 3.0 (*) 0.0 - 2.5    Dg Chest 2 View  06/10/2012  *RADIOLOGY REPORT*  Clinical Data: Chest pain  CHEST - 2 VIEW  Comparison: None.  Findings: Cardiac enlargement without heart failure.  Negative for edema or effusion.  Negative for pneumonia.  No mass lesion is identified.  IMPRESSION: Cardiac enlargement without acute cardiopulmonary disease.   Original Report Authenticated By: Janeece Riggers, M.D.    US Abdomen Complete  06/11/2012  *RADIOLOGY REPORT*  Clinical Data:  History of hepatitis C  ABDOMINAL ULTRASOUND COMPLETE  Comparison:  05/14/2005  Findings:  Gallbladder:  Surgically removed.  Common Bile Duct:  4.9 mm  Liver: No focal mass lesion identified.  Within normal limits in parenchymal echogenicity.  IVC:  Appears normal.  Pancreas:  No abnormality identified.  Spleen:  Within normal limits in size and echotexture.  Right kidney:  Normal in size (10.4 cm) and parenchymal echogenicity.  No evidence of  mass or hydronephrosis.  Left kidney:  Normal in size (10.6 cm) and parenchymal echogenicity.  No evidence of mass or hydronephrosis.  Abdominal Aorta:  Within normal limits  IMPRESSION: Status post cholecystectomy.  No acute abnormality is noted.   Original Report Authenticated By: Alcide Clever, M.D.     @ROS @ No weight gain/loss, Wears glasses, No hearing loss, + chest pain, + dyspnea, + edema, + hepatitis C, negative stroke, kidney stone or psych admission. No hemoptysis or gastrointestinal or genitourinary bleed.  Blood pressure 147/79, pulse 80, temperature 98.3 F (36.8 C), temperature source Oral, resp. rate 17, height 5' (1.524 m), weight 97.523 kg (215 lb), SpO2 91.00%.  General: Averagely developed and nourished. No distress.  HEENT: [place]/AT, brown eyes, conj-pale pink, sclera-non-icteric. Tongue pale pink, mid-line. Neck -No JVD. Lungs: Clear, bilaterally. Heart: Normal S1 and S2. II/VI systolic murmur. Abdomen:  Soft, non-tender. Ext-2 + lower ext. Edema with early varicose veins. CNS: Cranial nerves grossly intact. Moves all 4 ext.  Assessment/Plan Chest pain with abnormal cardiac enzymes.  -Offered nuclear stress test followed by cardiac catheterization. Patient and daughter are not ready for any procedure for now but agrees to out patient stress test in 1-2 weeks. Hypertension Decrease HCTZ or lasix use and consider vasodilator and negative inotropic medication. Bilateral Lower extremity edema:Ted hose use will benefit more than increasing lasix or HCTZ. UTI-on antibiotic Anemia COPD Gout    Alyse Kathan S 06/11/2012, 10:06 PM

## 2012-06-11 NOTE — Progress Notes (Signed)
Pt is c/o of burning and pain at urination Ilean Skill LPN

## 2012-06-12 DIAGNOSIS — R079 Chest pain, unspecified: Principal | ICD-10-CM

## 2012-06-12 LAB — URINE MICROSCOPIC-ADD ON

## 2012-06-12 LAB — BASIC METABOLIC PANEL
CO2: 32 mEq/L (ref 19–32)
Calcium: 9 mg/dL (ref 8.4–10.5)
Creatinine, Ser: 0.86 mg/dL (ref 0.50–1.10)
GFR calc non Af Amer: 64 mL/min — ABNORMAL LOW (ref >90)
Sodium: 136 mEq/L (ref 135–145)

## 2012-06-12 LAB — URINALYSIS, ROUTINE W REFLEX MICROSCOPIC
Glucose, UA: NEGATIVE mg/dL
Hgb urine dipstick: NEGATIVE
Ketones, ur: NEGATIVE mg/dL
Protein, ur: NEGATIVE mg/dL
Urobilinogen, UA: 0.2 mg/dL (ref 0.0–1.0)

## 2012-06-12 LAB — URINE CULTURE

## 2012-06-12 LAB — CK TOTAL AND CKMB (NOT AT ARMC)
Relative Index: 2.6 — ABNORMAL HIGH (ref 0.0–2.5)
Total CK: 204 U/L — ABNORMAL HIGH (ref 7–177)

## 2012-06-12 MED ORDER — CARVEDILOL 3.125 MG PO TABS: 3.1250 mg | ORAL_TABLET | Freq: Two times a day (BID) | ORAL | Status: DC

## 2012-06-12 MED ORDER — ISOSORBIDE MONONITRATE 15 MG HALF TABLET: 30.0000 mg | ORAL_TABLET | Freq: Every day | ORAL | Status: DC

## 2012-06-12 MED ORDER — PHENAZOPYRIDINE HCL 200 MG PO TABS
200.0000 mg | ORAL_TABLET | Freq: Once | ORAL | Status: AC
  Administered 2012-06-12: 200 mg via ORAL
  Filled 2012-06-12: qty 1

## 2012-06-12 MED ORDER — FUROSEMIDE 40 MG PO TABS: 40.0000 mg | ORAL_TABLET | Freq: Every day | ORAL | Status: DC

## 2012-06-12 NOTE — Progress Notes (Signed)
Pt and pt daughter provided with dc instructions and education. Pt and pt daughter verbalized understanding. Pt aware of new medicatinos and how they should be taken. No questions at this time. VSS. IV removed with tip intact. Heart monitor cleaned and returned to front. Pt leaving for home with her daughter via wheelchair. Levonne Spiller, RN

## 2012-06-12 NOTE — Progress Notes (Signed)
Occupational Therapy Treatment Patient Details Name: Monique Lewis MRN: [id number] DOB: 10-27-1932 Today's Date: 06/12/2012 Time: 4098-1191 OT Time Calculation (min): 25 min  OT Assessment / Plan / Recommendation Comments on Treatment Session Pt educated on use and availability of AE for LB ADL.  Instructed in energy conservation.  Plan is for pt to d/c today per chart.    Follow Up Recommendations  No OT follow up    Barriers to Discharge       Equipment Recommendations  3 in 1 bedside comode    Recommendations for Other Services    Frequency Min 2X/week   Plan Discharge plan remains appropriate    Precautions / Restrictions Precautions Precautions: Fall   Pertinent Vitals/Pain No c/o pain.    ADL  Grooming: Supervision/safety;Wash/dry hands Where Assessed - Grooming: Unsupported standing Toilet Transfer: Radiographer, therapeutic Method: Sit to Barista: Regular height toilet;Grab bars Toileting - Clothing Manipulation and Hygiene: Supervision/safety Where Assessed - Engineer, mining and Hygiene: Standing Equipment Used: Gait belt;Reacher;Long-handled sponge;Long-handled shoe horn;Sock aid Transfers/Ambulation Related to ADLs: supervision to and from bathroom ADL Comments: Instructed in use of AE for LB ADL.  Pt has personal care attendant she plans to rely on for this as prior to admission.    OT Diagnosis:    OT Problem List:   OT Treatment Interventions:     OT Goals Acute Rehab OT Goals OT Goal Formulation: With patient Time For Goal Achievement: 06/25/12 Potential to Achieve Goals: Good ADL Goals Pt Will Perform Lower Body Bathing: with supervision;Sit to stand from chair;Unsupported;with adaptive equipment ADL Goal: Lower Body Bathing - Progress: Progressing toward goals Pt Will Perform Lower Body Dressing: with supervision;Sit to stand from chair;Unsupported;with adaptive equipment;Other (comment) ADL Goal:  Lower Body Dressing - Progress: Progressing toward goals Pt Will Transfer to Toilet: with modified independence;with DME;3-in-1 ADL Goal: Toilet Transfer - Progress: Progressing toward goals  Visit Information  Last OT Received On: 06/12/12 Assistance Needed: +1    Subjective Data      Prior Functioning       Cognition  Overall Cognitive Status: Appears within functional limits for tasks assessed/performed Arousal/Alertness: Awake/alert Orientation Level: Appears intact for tasks assessed Behavior During Session: [facility] for tasks performed    Mobility  Shoulder Instructions Bed Mobility Bed Mobility: Not assessed Transfers Transfers: Sit to Stand;Stand to Sit Sit to Stand: From chair/3-in-1;From toilet;With upper extremity assist;5: Supervision Stand to Sit: 5: Supervision;To toilet;To chair/3-in-1       Exercises      Balance     End of Session OT - End of Session Activity Tolerance: Patient tolerated treatment well Patient left: in bed;with call bell/phone within reach;with bed alarm set  GO Functional Assessment Tool Used: clinical judgement Functional Limitation: Self care Self Care Current Status (Y7829): At least 1 percent but less than 20 percent impaired, limited or restricted Self Care Goal Status (F6213): At least 1 percent but less than 20 percent impaired, limited or restricted   Evern Bio 06/12/2012, 3:19 PM [phone]

## 2012-06-12 NOTE — Discharge Summary (Signed)
Physician Discharge Summary  Monique Lewis:[id number] DOB: [birth year] DOA: 06/10/2012  PCP: Dorrene German, MD  Admit date: 06/10/2012 Discharge date: 06/12/2012  Time spent: 35 minutes  Recommendations for Outpatient Follow-up:  1. Needs out-patient Stress test/Cardiac cath with Dr. Algie Coffer 2. Recommend slowly decreasing and discontinuing clonidine completely 3. Please followup echocardiogram that was formerly done as an outpatient-bedside echocardiogram was done on this admission which showed preserved LVH with no wall motion abnormalities as per Dr. Roseanne Kaufman not 4. Daily weights and home health RN have been ordered for CHF-will need basic metabolic panel in about 5 days 5. Patient had likely asymptomatic bacteria with multiple bacterial genotypes and was treated transiently on Rocephin which was discontinued  Discharge Diagnoses:  Principal Problem:  *Chest pain Active Problems:  Bilateral edema of lower extremity  HTN (hypertension)  Anemia  GERD (gastroesophageal reflux disease)  Depression  Hepatitis-C  COPD (chronic obstructive pulmonary disease)  Gout  Asthma  UTI (lower urinary tract infection)  Bradycardia   Discharge Condition: Good  Diet recommendation: Heart healthy low-salt  Filed Weights   06/10/12 1812 06/11/12 0613 06/12/12 0538  Weight: 95.981 kg (211 lb 9.6 oz) 97.523 kg (215 lb) 94.53 kg (208 lb 6.4 oz)    History of present illness:  78 yr old female admit 11.12.13 with LE swelling and Chest discomfort that started about 1 week ago but worsened progressively over the 3 days prior to admission. She describes it more as a pressure. It was intermittent and then became constant. She states that she is trial of Nexium around-the-clock and that this did not help. She is on a home dose of Lasix 20 mg every day as needed however this was not effective in reducing her lower strength is swelling. Her lower ext. swelling is worse on the right side than the  left because of 5 recurrent knee surgeries. She thought that this might have been an asthma attack and trial of Lasix at home. Her labs were otherwise unremarkable on admission   Hospital Course:  #1 chest tightness/chest pain-potentially 2/2 to Decompensated CHF>Liver dysfucntion  Questionable etiology. May be secondary to GI symptoms versus cardiac versus respiratory. cardiac enzymes show elevation of CK and CK-MB fractions. Troponin was negative however-I believe this may be secondary to decompensated CHF a 2-D echo is still pending. We'll place on scheduled nebulizer treatments. Place on a PPI. Follow. BNP was 118 on admit so not overtly CHF-Dr. Algie Coffer saw patient and offered an in-patient Stress which patient requested done as outpatient-meds were streamlined with Dr. Roseanne Kaufman guidance and patient will have a close follow-up-Given TED hose.  Started scheduled lasix 40 daily and HCTZ-close follow up with daily weight and BMEt in 3-5 days, might back down on diuretic as out-patient  #2 hypertension  Continue clonidine 0.1 twice a day, HCTZ 25 daily, lisinopril 10 mg daily, she will need addition of a beta blocker if she does turn out to have wall motion abnormalities on echocardiogram. I will implement cautiously Coreg 3.125 with hold parameters.  Continue Imdur 30 mg daily  #3 bilateral lower extremity edema  Patient's LFTs are within normal limits. Patient however does have a history of prior hepatitis C status post treatment. abdominal ultrasound to evaluate the liver was negative 06/11/2012. Urinalysis is negative for proteinuria.   #4 depression  Resume home dose Lexapro 10 mg daily   #5 urinary tract infection  Urine cultures showed Multiple phenotypes. Place on IV Rocephin 1 g every 24 hourly-DC antibiotics   #  6 sinus bradycardia  Stable. We'll cycle cardiac enzymes every 6 hours x3. 2-D echo is pending. Check a TSH. Patient is not on a rate limiting medication such as beta blocker  or calcium channel blocker. Follow on telemetry. Patient's brady resolved in hospital and coreg was kept at low dose on d/c  #7 COPD  Stable. Continue home regimen of Advair and Symbicort.   #8 gout  Stable.   #9 gastroesophageal reflux disease  We'll place on a PPI. GI cocktail as needed.   #10 anemia-microcytic  Check an anemia panel. No overt GI bleed. Follow H&H. Continue home dose iron supplementations. No Black stool   #11 prophylaxis  PPI for GI prophylaxis Lovenox for DVT prophy   Past medical history-As per Problem list  Chart reviewed as below-  Admit 12.22.06 with chronic Cholecystitis  Seen by Dr. Jacqualine Mau for Type1 a Hepatitis C  Consultants:  Cardiology-Dr. Algie Coffer  Procedures:  Ultrasound liver neg  Echo 06/11/12 pending  Antibiotics:  Treated with Rocephin for 2 days which was subsequently discontinued as urine culture is turned out negative  Discharge Exam: Filed Vitals:   06/11/12 2106 06/11/12 2145 06/12/12 0538 06/12/12 0740  BP: 147/79  174/82 181/82  Pulse: 76 80 55 71  Temp: 98.3 F (36.8 C)  98.3 F (36.8 C)   TempSrc: Oral  Oral   Resp: 17  16   Height:      Weight:   94.53 kg (208 lb 6.4 oz)   SpO2: 91%  98%       Discharge Instructions  Discharge Orders    Future Orders Please Complete By Expires   Heart failure home health orders      Comments:   Heart Failure Follow-up Care:  Verify follow-up appointments per Patient Discharge Instructions. Confirm transportation arranged. Reconcile home medications with discharge medication list. Remove discontinued medications from use. Assist patient/caregiver to manage medications using pill box. Reinforce low sodium food selection Assessments: Vital signs and oxygen saturation at each visit. Assess home environment for safety concerns, caregiver support and availability of low-sodium foods. Consult Child psychotherapist, PT/OT, Dietitian, and CNA based on assessments. Perform comprehensive  cardiopulmonary assessment. Notify MD for any change in condition or weight gain of 3 pounds in one day or 5 pounds in one week with symptoms. Daily Weights and Symptom Monitoring: Ensure patient has access to scales. Teach patient/caregiver to weigh daily before breakfast and after voiding using same scale and record.    Teach patient/caregiver to track weight and symptoms and when to notify Provider. Activity: Develop individualized activity plan with patient/caregiver.   Questions: Responses:   Skilled Nurse to notify MD of weight trends weekly for first 2 weeks. May fax or call: [facility] at [phone] (fax) or [phone]   Heart Failure Follow-up Care [facility] at [phone]   Home Health Visits    Obtain the following labs Basic Metabolic Panel   Lab frequency Weekly   Fax lab results to [facility] at [phone]   Diet Low Sodium Heart Healthy   Fluid restrictions: 1500 mL Fluid   Compression stockings      Diet - low sodium heart healthy      Increase activity slowly      Heart Failure patients record your daily weight using the same scale at the same time of day      Avoid straining      STOP any activity that causes chest pain, shortness of breath, dizziness,  sweating, or exessive weakness      Beta Blocker already ordered      Face-to-face encounter (required for Medicare/Medicaid patients)      Comments:   I Rhetta Mura certify that this patient is under my care and that I, or a nurse practitioner or physician's assistant working with me, had a face-to-face encounter that meets the physician face-to-face encounter requirements with this patient on 06/12/2012.   Questions: Responses:   The encounter with the patient was in whole, or in part, for the following medical condition, which is the primary reason for home health care chf   I certify that, based on my findings, the following services are medically necessary home health services Nursing   My  clinical findings support the need for the above services Complex treatment plan/patient with lack knowledge disease process and treatment   Further, I certify that my clinical findings support that this patient is homebound due to: Leaving home exacerbates symptoms (dyspnea, pain, anxiety, etc)   To provide the following care/treatments RN    Home Health Aide   ACE Inhibitor / ARB already ordered          Medication List     As of 06/12/2012  8:05 AM    STOP taking these medications         hydrocortisone 10 MG tablet   Commonly known as: CORTEF      TAKE these medications         carvedilol 3.125 MG tablet   Commonly known as: COREG   Take 1 tablet (3.125 mg total) by mouth 2 (two) times daily with a meal.      clobetasol cream 0.05 %   Commonly known as: TEMOVATE   Apply 1 application topically 2 (two) times daily.      cloNIDine 0.1 MG tablet   Commonly known as: CATAPRES   Take 0.1 mg by mouth 2 (two) times daily.      escitalopram 10 MG tablet   Commonly known as: LEXAPRO   Take 10 mg by mouth daily.      esomeprazole 40 MG capsule   Commonly known as: NEXIUM   Take 40 mg by mouth daily before breakfast.      ferrous fumarate 325 (106 FE) MG Tabs   Commonly known as: HEMOCYTE - 106 mg FE   Take 1 tablet by mouth 2 (two) times daily.      fluticasone 50 MCG/ACT nasal spray   Commonly known as: FLONASE   Place 2 sprays into the nose daily.      Fluticasone-Salmeterol 250-50 MCG/DOSE Aepb   Commonly known as: ADVAIR   Inhale 1 puff into the lungs every 12 (twelve) hours.      furosemide 40 MG tablet   Commonly known as: LASIX   Take 1 tablet (40 mg total) by mouth daily.      hydrochlorothiazide 25 MG tablet   Commonly known as: HYDRODIURIL   Take 25 mg by mouth daily.      isosorbide mononitrate 15 mg Tb24   Commonly known as: IMDUR   Take 1 tablet (30 mg total) by mouth daily.      lisinopril 10 MG tablet   Commonly known as: PRINIVIL,ZESTRIL    Take 10 mg by mouth daily.      potassium chloride SA 20 MEQ tablet   Commonly known as: K-DUR,KLOR-CON   Take 20 mEq by mouth daily.  The results of significant diagnostics from this hospitalization (including imaging, microbiology, ancillary and laboratory) are listed below for reference.    Significant Diagnostic Studies: Dg Chest 2 View  06/10/2012  *RADIOLOGY REPORT*  Clinical Data: Chest pain  CHEST - 2 VIEW  Comparison: None.  Findings: Cardiac enlargement without heart failure.  Negative for edema or effusion.  Negative for pneumonia.  No mass lesion is identified.  IMPRESSION: Cardiac enlargement without acute cardiopulmonary disease.   Original Report Authenticated By: Janeece Riggers, M.D.    US Abdomen Complete  06/11/2012  *RADIOLOGY REPORT*  Clinical Data:  History of hepatitis C  ABDOMINAL ULTRASOUND COMPLETE  Comparison:  05/14/2005  Findings:  Gallbladder:  Surgically removed.  Common Bile Duct:  4.9 mm  Liver: No focal mass lesion identified.  Within normal limits in parenchymal echogenicity.  IVC:  Appears normal.  Pancreas:  No abnormality identified.  Spleen:  Within normal limits in size and echotexture.  Right kidney:  Normal in size (10.4 cm) and parenchymal echogenicity.  No evidence of mass or hydronephrosis.  Left kidney:  Normal in size (10.6 cm) and parenchymal echogenicity.  No evidence of mass or hydronephrosis.  Abdominal Aorta:  Within normal limits  IMPRESSION: Status post cholecystectomy.  No acute abnormality is noted.   Original Report Authenticated By: Alcide Clever, M.D.     Microbiology: Recent Results (from the past 240 hour(s))  URINE CULTURE     Status: Normal   Collection Time   06/10/12  3:21 PM      Component Value Range Status Comment   Specimen Description URINE, CLEAN CATCH   Final    Special Requests NONE   Final    Culture  Setup Time 06/10/2012 16:06   Final    Colony Count >=100,000 COLONIES/ML   Final    Culture     Final     Value: Multiple bacterial morphotypes present, none predominant. Suggest appropriate recollection if clinically indicated.   Report Status 06/12/2012 FINAL   Final      Labs: Basic Metabolic Panel:  Lab 06/12/12 2956 06/11/12 0224 06/10/12 2055 06/10/12 1233  NA 136 138 -- 138  K 3.5 3.7 -- 4.0  CL 95* 99 -- 98  CO2 32 28 -- 32  GLUCOSE 141* 140* -- 123*  BUN 22 19 -- 21  CREATININE 0.86 0.74 0.72 0.80  CALCIUM 9.0 9.0 -- 9.6  MG -- -- 1.9 --  PHOS -- -- -- --   Liver Function Tests:  Lab 06/10/12 1233  AST 23  ALT 22  ALKPHOS 65  BILITOT 0.2*  PROT 6.3  ALBUMIN 3.0*    Lab 06/10/12 2055  LIPASE 25  AMYLASE --   No results found for this basename: AMMONIA:5 in the last 168 hours CBC:  Lab 06/11/12 0224 06/10/12 2055 06/10/12 1233  WBC 8.9 8.4 7.8  NEUTROABS -- -- 4.8  HGB 10.6* 12.6 10.5*  HCT 32.7* 38.6 33.2*  MCV 80.0 82.1 81.0  PLT 190 177 215   Cardiac Enzymes:  Lab 06/12/12 0300 06/11/12 1846 06/11/12 1311 06/11/12 0817 06/11/12 0224 06/10/12 2055  CKTOTAL 204* 184* 222* 210* 182* --  CKMB 5.4* 5.5* 6.2* 6.4* 5.8* --  CKMBINDEX -- -- -- -- -- --  TROPONINI -- -- -- <0.30 <0.30 <0.30   BNP: BNP (last 3 results)  Basename 06/10/12 1233  PROBNP 118.8   CBG: No results found for this basename: GLUCAP:5 in the last 168 hours     Signed:  Rhetta Mura  [facility] 06/12/2012, 8:05 AM

## 2012-06-12 NOTE — Progress Notes (Signed)
2 D echo completed 

## 2012-06-14 LAB — URINE CULTURE: Colony Count: >=100000

## 2012-06-19 ENCOUNTER — Other Ambulatory Visit (HOSPITAL_COMMUNITY): Payer: Self-pay | Admitting: Cardiovascular Disease

## 2012-06-19 DIAGNOSIS — R079 Chest pain, unspecified: Secondary | ICD-10-CM

## 2012-07-04 ENCOUNTER — Encounter (HOSPITAL_COMMUNITY)
Admission: RE | Admit: 2012-07-04 | Discharge: 2012-07-04 | Disposition: A | Discharge status: Home / Self Care | Payer: Medicare Other | Source: Ambulatory Visit | Attending: Cardiovascular Disease | Admitting: Cardiovascular Disease

## 2012-07-04 ENCOUNTER — Other Ambulatory Visit: Payer: Self-pay

## 2012-07-04 DIAGNOSIS — R079 Chest pain, unspecified: Secondary | ICD-10-CM | POA: Insufficient documentation

## 2012-07-04 MED ORDER — NITROGLYCERIN 0.4 MG SL SUBL
SUBLINGUAL_TABLET | SUBLINGUAL | Status: AC
  Administered 2012-07-04 (×2): 0.4 mg via SUBLINGUAL
  Filled 2012-07-04: qty 25

## 2012-07-04 MED ORDER — TECHNETIUM TC 99M SESTAMIBI GENERIC - CARDIOLITE
30.0000 | Freq: Once | INTRAVENOUS | Status: AC | PRN
  Administered 2012-07-04: 30 via INTRAVENOUS

## 2012-07-04 MED ORDER — REGADENOSON 0.4 MG/5ML IV SOLN
INTRAVENOUS | Status: AC
  Filled 2012-07-04: qty 5

## 2012-07-04 MED ORDER — HYDROCODONE-ACETAMINOPHEN 5-325 MG PO TABS
ORAL_TABLET | ORAL | Status: AC
  Filled 2012-07-04: qty 1

## 2012-07-04 MED ORDER — TECHNETIUM TC 99M SESTAMIBI GENERIC - CARDIOLITE: 30.0000 | Freq: Once | INTRAVENOUS | Status: AC | PRN

## 2012-07-04 MED ORDER — NITROGLYCERIN 0.4 MG SL SUBL: 0.4000 mg | SUBLINGUAL_TABLET | SUBLINGUAL | Status: DC | PRN

## 2012-07-04 MED ORDER — TECHNETIUM TC 99M SESTAMIBI GENERIC - CARDIOLITE
10.0000 | Freq: Once | INTRAVENOUS | Status: AC | PRN
  Administered 2012-07-04: 10 via INTRAVENOUS

## 2012-07-04 MED ORDER — HYDROCODONE-ACETAMINOPHEN 5-325 MG PO TABS
1.0000 | ORAL_TABLET | Freq: Once | ORAL | Status: AC
  Administered 2012-07-04: 1 via ORAL
  Filled 2012-07-04: qty 1

## 2012-07-04 MED ORDER — REGADENOSON 0.4 MG/5ML IV SOLN
0.4000 mg | Freq: Once | INTRAVENOUS | Status: AC
  Administered 2012-07-04: 0.4 mg via INTRAVENOUS

## 2012-07-04 NOTE — Progress Notes (Signed)
Pt having chest pain after Lexiscan.  Per Dr. Algie Coffer, NTG SL tablets given X 2 and Vicodin x 1 given.  Pt is eating and drinking but still complaining of chest pressure and tightness.   BP and HR have decreased and per Dr. Algie Coffer, pt's EKG is fine and no need to have continuous monitoring for pt.  Pt states that she has GERD and she takes Nexium for this but did not take it this am.

## 2012-07-11 ENCOUNTER — Emergency Department (HOSPITAL_COMMUNITY)
Admission: EM | Admit: 2012-07-11 | Discharge: 2012-07-11 | Disposition: A | Discharge status: Home / Self Care | Payer: Medicare Other | Attending: Emergency Medicine | Admitting: Emergency Medicine

## 2012-07-11 ENCOUNTER — Emergency Department (HOSPITAL_COMMUNITY): Payer: Medicare Other

## 2012-07-11 ENCOUNTER — Encounter (HOSPITAL_COMMUNITY): Payer: Self-pay | Admitting: Emergency Medicine

## 2012-07-11 DIAGNOSIS — R011 Cardiac murmur, unspecified: Secondary | ICD-10-CM | POA: Insufficient documentation

## 2012-07-11 DIAGNOSIS — M545 Low back pain, unspecified: Secondary | ICD-10-CM | POA: Insufficient documentation

## 2012-07-11 DIAGNOSIS — G8929 Other chronic pain: Secondary | ICD-10-CM | POA: Insufficient documentation

## 2012-07-11 DIAGNOSIS — R609 Edema, unspecified: Secondary | ICD-10-CM | POA: Insufficient documentation

## 2012-07-11 DIAGNOSIS — M129 Arthropathy, unspecified: Secondary | ICD-10-CM | POA: Insufficient documentation

## 2012-07-11 DIAGNOSIS — Z8701 Personal history of pneumonia (recurrent): Secondary | ICD-10-CM | POA: Insufficient documentation

## 2012-07-11 DIAGNOSIS — K219 Gastro-esophageal reflux disease without esophagitis: Secondary | ICD-10-CM | POA: Insufficient documentation

## 2012-07-11 DIAGNOSIS — Z862 Personal history of diseases of the blood and blood-forming organs and certain disorders involving the immune mechanism: Secondary | ICD-10-CM | POA: Insufficient documentation

## 2012-07-11 DIAGNOSIS — I10 Essential (primary) hypertension: Secondary | ICD-10-CM | POA: Insufficient documentation

## 2012-07-11 DIAGNOSIS — J45909 Unspecified asthma, uncomplicated: Secondary | ICD-10-CM | POA: Insufficient documentation

## 2012-07-11 DIAGNOSIS — D649 Anemia, unspecified: Secondary | ICD-10-CM | POA: Insufficient documentation

## 2012-07-11 DIAGNOSIS — Z8619 Personal history of other infectious and parasitic diseases: Secondary | ICD-10-CM | POA: Insufficient documentation

## 2012-07-11 DIAGNOSIS — I509 Heart failure, unspecified: Secondary | ICD-10-CM | POA: Insufficient documentation

## 2012-07-11 DIAGNOSIS — J449 Chronic obstructive pulmonary disease, unspecified: Secondary | ICD-10-CM | POA: Insufficient documentation

## 2012-07-11 DIAGNOSIS — Z79899 Other long term (current) drug therapy: Secondary | ICD-10-CM | POA: Insufficient documentation

## 2012-07-11 DIAGNOSIS — J4489 Other specified chronic obstructive pulmonary disease: Secondary | ICD-10-CM | POA: Insufficient documentation

## 2012-07-11 DIAGNOSIS — F329 Major depressive disorder, single episode, unspecified: Secondary | ICD-10-CM | POA: Insufficient documentation

## 2012-07-11 DIAGNOSIS — Z8639 Personal history of other endocrine, nutritional and metabolic disease: Secondary | ICD-10-CM | POA: Insufficient documentation

## 2012-07-11 DIAGNOSIS — F3289 Other specified depressive episodes: Secondary | ICD-10-CM | POA: Insufficient documentation

## 2012-07-11 DIAGNOSIS — Z87440 Personal history of urinary (tract) infections: Secondary | ICD-10-CM | POA: Insufficient documentation

## 2012-07-11 LAB — BASIC METABOLIC PANEL
Calcium: 9.4 mg/dL (ref 8.4–10.5)
GFR calc Af Amer: 55 mL/min — ABNORMAL LOW (ref >90)
GFR calc non Af Amer: 48 mL/min — ABNORMAL LOW (ref >90)
Potassium: 2.9 mEq/L — ABNORMAL LOW (ref 3.5–5.1)
Sodium: 139 mEq/L (ref 135–145)

## 2012-07-11 LAB — CBC WITH DIFFERENTIAL/PLATELET
Basophils Absolute: 0 10*3/uL (ref 0.0–0.1)
Eosinophils Absolute: 0 10*3/uL (ref 0.0–0.7)
Eosinophils Relative: 0 % (ref 0–5)
MCH: 26.5 pg (ref 26.0–34.0)
MCV: 81.6 fL (ref 78.0–100.0)
Neutrophils Relative %: 79 % — ABNORMAL HIGH (ref 43–77)
Platelets: 273 10*3/uL (ref 150–400)
RDW: 16.6 % — ABNORMAL HIGH (ref 11.5–15.5)
WBC: 11.7 10*3/uL — ABNORMAL HIGH (ref 4.0–10.5)

## 2012-07-11 LAB — POCT I-STAT TROPONIN I: Troponin i, poc: 0.01 ng/mL (ref 0.00–0.08)

## 2012-07-11 LAB — PRO B NATRIURETIC PEPTIDE: Pro B Natriuretic peptide (BNP): 306.1 pg/mL (ref 0–450)

## 2012-07-11 MED ORDER — LISINOPRIL 20 MG PO TABS: 20.0000 mg | ORAL_TABLET | Freq: Every day | ORAL | Status: DC

## 2012-07-11 MED ORDER — POTASSIUM CHLORIDE CRYS ER 20 MEQ PO TBCR
40.0000 meq | EXTENDED_RELEASE_TABLET | Freq: Once | ORAL | Status: AC
  Administered 2012-07-11: 40 meq via ORAL
  Filled 2012-07-11: qty 2

## 2012-07-11 NOTE — ED Provider Notes (Signed)
History     CSN: [id number]  Arrival date & time 07/11/12  1255   First MD Initiated Contact with Patient 07/11/12 1503      Chief Complaint  Patient presents with  . Chest Pain    (Consider location/radiation/quality/duration/timing/severity/associated sxs/prior treatment) HPI Pt with history of HTN and CHF reports she has had increased swelling in legs for the last few days, associated with diffuse aching and burning pain. She has had some SOB and CP as well, although there does not appear to be any particular provoking or relieving factors. She had a recent admission for same, started on low dose of Lasix and had HTN meds adjusted. She reports she has run out of some of her meds which have not arrived via mail order, although there is some confusion as to which meds she has and which she is out of. She had a nuclear stress test 1 week ago which was normal. She apparently was advised to come to Dr. Roseanne Kaufman office today but came to the ED instead.   Past Medical History  Diagnosis Date  . Hypertension   . COPD (chronic obstructive pulmonary disease)   . HTN (hypertension) 06/10/2012  . GERD (gastroesophageal reflux disease) 06/10/2012  . Depression 06/10/2012  . Hepatitis-C 06/10/2012    Hx of Hep C after knee surgery 2001 s/p treatment per patient.  . Gout 06/10/2012  . Asthma 06/10/2012  . CHF (congestive heart failure)   . Heart murmur   . Anginal pain   . Pneumonia ~ 2001  . Exertional dyspnea   . Iron deficiency anemia 06/10/2012  . History of blood transfusion     "several; first one I caught Hepatitis" (06/10/12)  . Headache     "severe last 3 wk" (06/10/12)  . Arthritis     "all over my body" (06/10/12)  . Chronic lower back pain   . Recurrent UTI (urinary tract infection)     "get them maybe q 6 months" (06/10/12)    Past Surgical History  Procedure Date  . Cholecystectomy 2006  . Tonsillectomy and adenoidectomy 1940's  . Dilation and curettage of uterus    . Joint replacement     s/p 5 knee replacements  . Total knee arthroplasty 2001 - 2011    "5 total; have had them done on both knees" (06/10/12)    History reviewed. No pertinent family history.  History  Substance Use Topics  . Smoking status: Never Smoker   . Smokeless tobacco: Never Used  . Alcohol Use: No    OB History    Grav Para Term Preterm Abortions TAB SAB Ect Mult Living                  Review of Systems All other systems reviewed and are negative except as noted in HPI.   Allergies  Celebrex and Sulfa antibiotics  Home Medications   Current Outpatient Rx  Name  Route  Sig  Dispense  Refill  . ESCITALOPRAM OXALATE 10 MG PO TABS   Oral   Take 10 mg by mouth daily.         Marland Kitchen ESOMEPRAZOLE MAGNESIUM 40 MG PO CPDR   Oral   Take 40 mg by mouth daily before breakfast.         . FLUTICASONE-SALMETEROL 250-50 MCG/DOSE IN AEPB   Inhalation   Inhale 1 puff into the lungs every 12 (twelve) hours.         . FUROSEMIDE 40 MG PO  TABS   Oral   Take 1 tablet (40 mg total) by mouth daily.   30 tablet   0   . LISINOPRIL 10 MG PO TABS   Oral   Take 10 mg by mouth daily.         Marland Kitchen POTASSIUM CHLORIDE CRYS ER 20 MEQ PO TBCR   Oral   Take 20 mEq by mouth daily.         Marland Kitchen CARVEDILOL 3.125 MG PO TABS   Oral   Take 1 tablet (3.125 mg total) by mouth 2 (two) times daily with a meal.   60 tablet   0   . CLOBETASOL PROPIONATE 0.05 % EX CREA   Topical   Apply 1 application topically 2 (two) times daily.         Marland Kitchen CLONIDINE HCL 0.1 MG PO TABS   Oral   Take 0.1 mg by mouth 2 (two) times daily.         Marland Kitchen FERROUS FUMARATE 325 (106 FE) MG PO TABS   Oral   Take 1 tablet by mouth 2 (two) times daily.         Marland Kitchen FLUTICASONE PROPIONATE 50 MCG/ACT NA SUSP   Nasal   Place 2 sprays into the nose daily.         . ISOSORBIDE MONONITRATE 15 MG HALF TABLET   Oral   Take 1 tablet (30 mg total) by mouth daily.   39 tablet   0     BP 194/71  Pulse  66  Temp 97.9 F (36.6 C) (Oral)  Resp 18  SpO2 95%  Physical Exam  Nursing note and vitals reviewed. Constitutional: She is oriented to person, place, and time. She appears well-developed and well-nourished.  HENT:  Head: Normocephalic and atraumatic.  Eyes: EOM are normal. Pupils are equal, round, and reactive to light.  Neck: Normal range of motion. Neck supple.  Cardiovascular: Normal rate, normal heart sounds and intact distal pulses.   Pulmonary/Chest: Effort normal and breath sounds normal.  Abdominal: Bowel sounds are normal. She exhibits no distension. There is no tenderness.  Musculoskeletal: Normal range of motion. She exhibits edema (symmetrical 2+ LE edema from knees to feet) and tenderness.  Neurological: She is alert and oriented to person, place, and time. She has normal strength. No cranial nerve deficit or sensory deficit.  Skin: Skin is warm and dry. No rash noted.  Psychiatric: She has a normal mood and affect.    ED Course  Procedures (including critical care time)  Labs Reviewed  CBC WITH DIFFERENTIAL - Abnormal; Notable for the following:    WBC 11.7 (*)     Hemoglobin 11.7 (*)     RDW 16.6 (*)     Neutrophils Relative 79 (*)     Neutro Abs 9.2 (*)     All other components within normal limits  BASIC METABOLIC PANEL - Abnormal; Notable for the following:    Potassium 2.9 (*)     Chloride 94 (*)     CO2 34 (*)     Glucose, Bld 193 (*)     BUN 27 (*)     GFR calc non Af Amer 48 (*)     GFR calc Af Amer 55 (*)     All other components within normal limits  PRO B NATRIURETIC PEPTIDE  POCT I-STAT TROPONIN I   Dg Chest 2 View  07/11/2012  *RADIOLOGY REPORT*  Clinical Data: Chest pain.  Hypertension.  CHEST -  2 VIEW  Comparison: 06/10/2012  Findings: Mild cardiomegaly and ectasia thoracic aorta stable. Moderate hiatal hernia is seen.  Pulmonary hyperinflation again demonstrated, suspicious for COPD.  No evidence of pulmonary infiltrate or edema.  No  evidence of pleural effusion.  No mass or lymphadenopathy identified.  IMPRESSION: Stable cardiomegaly, COPD, and hiatal hernia.  No acute findings.   Original Report Authenticated By: Myles Rosenthal, M.D.      No diagnosis found.    MDM   Date: 07/11/2012  Rate: 62  Rhythm: normal sinus rhythm  QRS Axis: normal  Intervals: normal  ST/T Wave abnormalities: nonspecific ST changes  Conduction Disutrbances:none  Narrative Interpretation:   Old EKG Reviewed: unchanged  Discussed with Dr. Algie Coffer who is familiar with the patient. He does not recommend increasing Lasix due to risk of renal failure. Advised to continue with potassium at home. He recommends compression stockings which the patient says she has ready for her at pharmacy.          Deborha Moseley B. Bernette Mayers, MD 07/11/12 [phone]

## 2012-07-11 NOTE — ED Notes (Signed)
Pt c/o left sided CP with SOB and swelling in legs; pt sts hx of CHF; pt sts out of BP meds x 3 days

## 2012-09-02 ENCOUNTER — Other Ambulatory Visit: Payer: Self-pay

## 2012-09-02 ENCOUNTER — Encounter (HOSPITAL_COMMUNITY): Payer: Self-pay | Admitting: General Practice

## 2012-09-02 ENCOUNTER — Inpatient Hospital Stay (HOSPITAL_COMMUNITY)
Admission: AD | Admit: 2012-09-02 | Discharge: 2012-09-05 | DRG: 292 | Disposition: A | Discharge status: Home / Self Care | Payer: Medicare Other | Source: Ambulatory Visit | Attending: Cardiovascular Disease | Admitting: Cardiovascular Disease

## 2012-09-02 DIAGNOSIS — J4489 Other specified chronic obstructive pulmonary disease: Secondary | ICD-10-CM | POA: Diagnosis present

## 2012-09-02 DIAGNOSIS — D509 Iron deficiency anemia, unspecified: Secondary | ICD-10-CM | POA: Diagnosis present

## 2012-09-02 DIAGNOSIS — M109 Gout, unspecified: Secondary | ICD-10-CM | POA: Diagnosis present

## 2012-09-02 DIAGNOSIS — K219 Gastro-esophageal reflux disease without esophagitis: Secondary | ICD-10-CM | POA: Diagnosis present

## 2012-09-02 DIAGNOSIS — Z79899 Other long term (current) drug therapy: Secondary | ICD-10-CM

## 2012-09-02 DIAGNOSIS — I509 Heart failure, unspecified: Secondary | ICD-10-CM | POA: Diagnosis present

## 2012-09-02 DIAGNOSIS — B1920 Unspecified viral hepatitis C without hepatic coma: Secondary | ICD-10-CM | POA: Diagnosis present

## 2012-09-02 DIAGNOSIS — Z96659 Presence of unspecified artificial knee joint: Secondary | ICD-10-CM

## 2012-09-02 DIAGNOSIS — F329 Major depressive disorder, single episode, unspecified: Secondary | ICD-10-CM | POA: Diagnosis present

## 2012-09-02 DIAGNOSIS — F3289 Other specified depressive episodes: Secondary | ICD-10-CM | POA: Diagnosis present

## 2012-09-02 DIAGNOSIS — Z882 Allergy status to sulfonamides status: Secondary | ICD-10-CM

## 2012-09-02 DIAGNOSIS — E876 Hypokalemia: Secondary | ICD-10-CM | POA: Diagnosis present

## 2012-09-02 DIAGNOSIS — I5031 Acute diastolic (congestive) heart failure: Principal | ICD-10-CM | POA: Diagnosis present

## 2012-09-02 DIAGNOSIS — Z6841 Body Mass Index (BMI) 40.0 and over, adult: Secondary | ICD-10-CM

## 2012-09-02 DIAGNOSIS — I10 Essential (primary) hypertension: Secondary | ICD-10-CM | POA: Diagnosis present

## 2012-09-02 DIAGNOSIS — J449 Chronic obstructive pulmonary disease, unspecified: Secondary | ICD-10-CM | POA: Diagnosis present

## 2012-09-02 LAB — COMPREHENSIVE METABOLIC PANEL
ALT: 29 U/L (ref 0–35)
AST: 45 U/L — ABNORMAL HIGH (ref 0–37)
CO2: 33 mEq/L — ABNORMAL HIGH (ref 19–32)
Calcium: 8.7 mg/dL (ref 8.4–10.5)
Chloride: 103 mEq/L (ref 96–112)
GFR calc non Af Amer: 66 mL/min — ABNORMAL LOW (ref >90)
Sodium: 143 mEq/L (ref 135–145)

## 2012-09-02 LAB — CBC WITH DIFFERENTIAL/PLATELET
Basophils Absolute: 0 10*3/uL (ref 0.0–0.1)
Eosinophils Relative: 2 % (ref 0–5)
Lymphocytes Relative: 26 % (ref 12–46)
Neutro Abs: 4.8 10*3/uL (ref 1.7–7.7)
Platelets: 279 10*3/uL (ref 150–400)
RDW: 15 % (ref 11.5–15.5)
WBC: 7.9 10*3/uL (ref 4.0–10.5)

## 2012-09-02 LAB — APTT: aPTT: 35 seconds (ref 24–37)

## 2012-09-02 MED ORDER — FLUTICASONE PROPIONATE 50 MCG/ACT NA SUSP
2.0000 | Freq: Every day | NASAL | Status: DC
  Administered 2012-09-02 – 2012-09-05 (×4): 2 via NASAL
  Filled 2012-09-02: qty 16

## 2012-09-02 MED ORDER — CLONIDINE HCL 0.1 MG PO TABS
0.1000 mg | ORAL_TABLET | Freq: Two times a day (BID) | ORAL | Status: DC
  Administered 2012-09-02 – 2012-09-05 (×7): 0.1 mg via ORAL
  Filled 2012-09-02 (×8): qty 1

## 2012-09-02 MED ORDER — ISOSORBIDE MONONITRATE ER 30 MG PO TB24
30.0000 mg | ORAL_TABLET | Freq: Every day | ORAL | Status: DC
  Administered 2012-09-02 – 2012-09-05 (×4): 30 mg via ORAL
  Filled 2012-09-02 (×4): qty 1

## 2012-09-02 MED ORDER — POTASSIUM CHLORIDE CRYS ER 20 MEQ PO TBCR
20.0000 meq | EXTENDED_RELEASE_TABLET | Freq: Every day | ORAL | Status: DC
  Administered 2012-09-02: 20 meq via ORAL
  Filled 2012-09-02: qty 1

## 2012-09-02 MED ORDER — ESCITALOPRAM OXALATE 10 MG PO TABS
10.0000 mg | ORAL_TABLET | Freq: Every day | ORAL | Status: DC
  Administered 2012-09-02 – 2012-09-05 (×4): 10 mg via ORAL
  Filled 2012-09-02 (×4): qty 1

## 2012-09-02 MED ORDER — DOXEPIN HCL 50 MG PO CAPS
50.0000 mg | ORAL_CAPSULE | Freq: Every day | ORAL | Status: DC
  Administered 2012-09-02 – 2012-09-05 (×4): 50 mg via ORAL
  Filled 2012-09-02 (×4): qty 1

## 2012-09-02 MED ORDER — FUROSEMIDE 10 MG/ML IJ SOLN
60.0000 mg | Freq: Two times a day (BID) | INTRAMUSCULAR | Status: DC
  Administered 2012-09-02 – 2012-09-05 (×6): 60 mg via INTRAVENOUS
  Filled 2012-09-02 (×6): qty 6

## 2012-09-02 MED ORDER — MOMETASONE FURO-FORMOTEROL FUM 100-5 MCG/ACT IN AERO
2.0000 | INHALATION_SPRAY | Freq: Two times a day (BID) | RESPIRATORY_TRACT | Status: DC
  Administered 2012-09-02 – 2012-09-05 (×6): 2 via RESPIRATORY_TRACT
  Filled 2012-09-02: qty 8.8

## 2012-09-02 MED ORDER — SODIUM CHLORIDE 0.9 % IV SOLN: 250.0000 mL | INTRAVENOUS | Status: DC | PRN

## 2012-09-02 MED ORDER — FUROSEMIDE 10 MG/ML IJ SOLN
INTRAMUSCULAR | Status: AC
  Filled 2012-09-02: qty 8

## 2012-09-02 MED ORDER — CARVEDILOL 3.125 MG PO TABS
3.1250 mg | ORAL_TABLET | Freq: Two times a day (BID) | ORAL | Status: DC
  Administered 2012-09-02 – 2012-09-04 (×5): 3.125 mg via ORAL
  Filled 2012-09-02 (×8): qty 1

## 2012-09-02 MED ORDER — ACETAMINOPHEN 325 MG PO TABS
650.0000 mg | ORAL_TABLET | ORAL | Status: DC | PRN
  Administered 2012-09-02 – 2012-09-03 (×2): 650 mg via ORAL
  Filled 2012-09-02 (×2): qty 2

## 2012-09-02 MED ORDER — PANTOPRAZOLE SODIUM 40 MG PO TBEC
40.0000 mg | DELAYED_RELEASE_TABLET | Freq: Every day | ORAL | Status: DC
  Administered 2012-09-02 – 2012-09-05 (×4): 40 mg via ORAL
  Filled 2012-09-02 (×4): qty 1

## 2012-09-02 MED ORDER — SODIUM CHLORIDE 0.9 % IJ SOLN: 3.0000 mL | INTRAMUSCULAR | Status: DC | PRN

## 2012-09-02 MED ORDER — LISINOPRIL 20 MG PO TABS
20.0000 mg | ORAL_TABLET | Freq: Every day | ORAL | Status: DC
  Administered 2012-09-02 – 2012-09-05 (×4): 20 mg via ORAL
  Filled 2012-09-02 (×4): qty 1

## 2012-09-02 MED ORDER — ONDANSETRON HCL 4 MG/2ML IJ SOLN: 4.0000 mg | Freq: Four times a day (QID) | INTRAMUSCULAR | Status: DC | PRN

## 2012-09-02 MED ORDER — HEPARIN SODIUM (PORCINE) 5000 UNIT/ML IJ SOLN
5000.0000 [IU] | Freq: Three times a day (TID) | INTRAMUSCULAR | Status: DC
  Administered 2012-09-02 – 2012-09-05 (×9): 5000 [IU] via SUBCUTANEOUS
  Filled 2012-09-02 (×12): qty 1

## 2012-09-02 MED ORDER — FERROUS FUMARATE 325 (106 FE) MG PO TABS
1.0000 | ORAL_TABLET | Freq: Two times a day (BID) | ORAL | Status: DC
  Administered 2012-09-02 – 2012-09-05 (×7): 106 mg via ORAL
  Filled 2012-09-02 (×8): qty 1

## 2012-09-02 MED ORDER — SODIUM CHLORIDE 0.9 % IJ SOLN
3.0000 mL | Freq: Two times a day (BID) | INTRAMUSCULAR | Status: DC
  Administered 2012-09-02 – 2012-09-05 (×6): 3 mL via INTRAVENOUS

## 2012-09-02 MED ORDER — POTASSIUM CHLORIDE CRYS ER 20 MEQ PO TBCR
40.0000 meq | EXTENDED_RELEASE_TABLET | Freq: Three times a day (TID) | ORAL | Status: DC
  Administered 2012-09-02 – 2012-09-03 (×4): 40 meq via ORAL
  Filled 2012-09-02 (×5): qty 2

## 2012-09-02 NOTE — H&P (Signed)
Monique Lewis is an 79 y.o. female.   Chief Complaint: Leg edema and shortness of breath. HPI: 79 years old female with 10 pounds of weight gain and bilateral leg edema with shortness of breath x 1 week. Recent nuclear stress test without inducible ischemia.  Past Medical History  Diagnosis Date  . Hypertension   . COPD (chronic obstructive pulmonary disease)   . HTN (hypertension) 06/10/2012  . GERD (gastroesophageal reflux disease) 06/10/2012  . Depression 06/10/2012  . Hepatitis-C 06/10/2012    Hx of Hep C after knee surgery 2001 Lewis/p treatment per patient.  . Gout 06/10/2012  . Asthma 06/10/2012  . CHF (congestive heart failure)   . Heart murmur   . Anginal pain   . Pneumonia ~ 2001  . Exertional dyspnea   . Iron deficiency anemia 06/10/2012  . History of blood transfusion     "several; first one I caught Hepatitis" (06/10/12)  . Headache     "severe last 3 wk" (06/10/12)  . Arthritis     "all over my body" (06/10/12)  . Chronic lower back pain   . Recurrent UTI (urinary tract infection)     "get them maybe q 6 months" (06/10/12)      Past Surgical History  Procedure Date  . Cholecystectomy 2006  . Tonsillectomy and adenoidectomy 1940'Lewis  . Dilation and curettage of uterus   . Joint replacement     Lewis/p 5 knee replacements  . Total knee arthroplasty 2001 - 2011    "5 total; have had them done on both knees" (06/10/12)    History reviewed. No pertinent family history. Social History:  reports that she has never smoked. She has never used smokeless tobacco. She reports that she does not drink alcohol or use illicit drugs.  Allergies:  Allergies  Allergen Reactions  . Celebrex (Celecoxib) Itching  . Sulfa Antibiotics Itching    Medications Prior to Admission  Medication Sig Dispense Refill  . albuterol (PROVENTIL HFA;VENTOLIN HFA) 108 (90 BASE) MCG/ACT inhaler Inhale 2 puffs into the lungs every 4 (four) hours as needed. For wheezing      .  budesonide-formoterol (SYMBICORT) 160-4.5 MCG/ACT inhaler Inhale 2 puffs into the lungs 2 (two) times daily.      . carvedilol (COREG) 3.125 MG tablet Take 3.125 mg by mouth daily.      . clobetasol cream (TEMOVATE) 0.05 % Apply 1 application topically 2 (two) times daily.      . cloNIDine (CATAPRES) 0.1 MG tablet Take 0.1 mg by mouth 2 (two) times daily.      Marland Kitchen doxepin (SINEQUAN) 50 MG capsule Take 50 mg by mouth at bedtime.      Marland Kitchen escitalopram (LEXAPRO) 10 MG tablet Take 10 mg by mouth daily.      Marland Kitchen esomeprazole (NEXIUM) 40 MG capsule Take 40 mg by mouth daily before breakfast.      . ferrous fumarate (HEMOCYTE - 106 MG FE) 325 (106 FE) MG TABS Take 1 tablet by mouth 2 (two) times daily.      . fluticasone (FLONASE) 50 MCG/ACT nasal spray Place 2 sprays into the nose daily.      . Fluticasone-Salmeterol (ADVAIR) 250-50 MCG/DOSE AEPB Inhale 1 puff into the lungs every 12 (twelve) hours.      . furosemide (LASIX) 40 MG tablet Take 1 tablet (40 mg total) by mouth daily.  30 tablet  0  . hydrOXYzine (ATARAX/VISTARIL) 25 MG tablet Take 25 mg by mouth every 4 (four) hours  as needed. For itching      . lisinopril (PRINIVIL,ZESTRIL) 20 MG tablet Take 1 tablet (20 mg total) by mouth daily.  30 tablet  0  . loratadine (CLARITIN) 10 MG tablet Take 10 mg by mouth daily.      . potassium chloride SA (K-DUR,KLOR-CON) 20 MEQ tablet Take 20 mEq by mouth daily.        Results for orders placed during the hospital encounter of 09/02/12 (from the past 48 hour(Lewis))  CBC WITH DIFFERENTIAL     Status: Abnormal   Collection Time   09/02/12  2:22 PM      Component Value Range Comment   WBC 7.9  4.0 - 10.5 K/uL    RBC 3.74 (*) 3.87 - 5.11 MIL/uL    Hemoglobin 9.9 (*) 12.0 - 15.0 g/dL    HCT 40.9 (*) 81.1 - 46.0 %    MCV 82.6  78.0 - 100.0 fL    MCH 26.5  26.0 - 34.0 pg    MCHC 32.0  30.0 - 36.0 g/dL    RDW 91.4  78.2 - 95.6 %    Platelets 279  150 - 400 K/uL    Neutrophils Relative 61  43 - 77 %    Neutro Abs  4.8  1.7 - 7.7 K/uL    Lymphocytes Relative 26  12 - 46 %    Lymphs Abs 2.1  0.7 - 4.0 K/uL    Monocytes Relative 11  3 - 12 %    Monocytes Absolute 0.9  0.1 - 1.0 K/uL    Eosinophils Relative 2  0 - 5 %    Eosinophils Absolute 0.2  0.0 - 0.7 K/uL    Basophils Relative 0  0 - 1 %    Basophils Absolute 0.0  0.0 - 0.1 K/uL   COMPREHENSIVE METABOLIC PANEL     Status: Abnormal   Collection Time   09/02/12  2:22 PM      Component Value Range Comment   Sodium 143  135 - 145 mEq/L    Potassium 2.9 (*) 3.5 - 5.1 mEq/L    Chloride 103  96 - 112 mEq/L    CO2 33 (*) 19 - 32 mEq/L    Glucose, Bld 150 (*) 70 - 99 mg/dL    BUN 19  6 - 23 mg/dL    Creatinine, Ser 2.13  0.50 - 1.10 mg/dL    Calcium 8.7  8.4 - 08.6 mg/dL    Total Protein 6.6  6.0 - 8.3 g/dL    Albumin 3.0 (*) 3.5 - 5.2 g/dL    AST 45 (*) 0 - 37 U/L    ALT 29  0 - 35 U/L    Alkaline Phosphatase 61  39 - 117 U/L    Total Bilirubin 0.2 (*) 0.3 - 1.2 mg/dL    GFR calc non Af Amer 66 (*) >90 mL/min    GFR calc Af Amer 77 (*) >90 mL/min   PROTIME-INR     Status: Normal   Collection Time   09/02/12  2:22 PM      Component Value Range Comment   Prothrombin Time 13.0  11.6 - 15.2 seconds    INR 0.99  0.00 - 1.49   APTT     Status: Normal   Collection Time   09/02/12  2:22 PM      Component Value Range Comment   aPTT 35  24 - 37 seconds   PRO B NATRIURETIC PEPTIDE  Status: Normal   Collection Time   09/02/12  2:30 PM      Component Value Range Comment   Pro B Natriuretic peptide (BNP) 368.6  0 - 450 pg/mL    No results found.  @ROS @ + weight gain/loss, Wears glasses, No hearing loss, + chest pain, + dyspnea, + edema, + hepatitis C, negative stroke, kidney stone or psych admission. No hemoptysis or gastrointestinal or genitourinary bleed.  Blood pressure 179/82, pulse 68, temperature 98 F (36.7 C), temperature source Oral, resp. rate 20, height 5' (1.524 m), weight 100.653 kg (221 lb 14.4 oz), SpO2 99.00%. General: Averagely  developed and well nourished. Mild respiratory distress.  HEENT: [place]/AT, brown eyes, conj-pale pink, sclera-non-icteric. Tongue pale pink, mid-line.  Neck -No JVD.  Lungs: Bil. Basal crackles, bilaterally.  Heart: Normal S1 and S2. II/VI systolic murmur.  Abdomen: Soft, distendednon-tender.  Ext-2 + lower ext. Edema with early varicose veins.  CNS: Cranial nerves grossly intact. Moves all 4 ext. Skin:-Warm and dry  Assessment/Plan Acute left heart diastolic failure Hypertension Bilateral lower extremity edema Chronic anemia COPD Gout Hypokalemia  Admit/IV lasix/Ted hose/Blood work/K+ supplementation  Monique Lewis 09/02/2012, 6:24 PM

## 2012-09-03 ENCOUNTER — Inpatient Hospital Stay (HOSPITAL_COMMUNITY): Payer: Medicare Other

## 2012-09-03 DIAGNOSIS — I059 Rheumatic mitral valve disease, unspecified: Secondary | ICD-10-CM

## 2012-09-03 LAB — BASIC METABOLIC PANEL
BUN: 16 mg/dL (ref 6–23)
Chloride: 105 mEq/L (ref 96–112)
GFR calc Af Amer: 79 mL/min — ABNORMAL LOW (ref >90)
Potassium: 3.9 mEq/L (ref 3.5–5.1)
Sodium: 142 mEq/L (ref 135–145)

## 2012-09-03 LAB — MRSA PCR SCREENING: MRSA by PCR: NEGATIVE

## 2012-09-03 MED ORDER — MAGNESIUM HYDROXIDE 400 MG/5ML PO SUSP
30.0000 mL | Freq: Every day | ORAL | Status: DC | PRN
Start: 2012-09-03 — End: 2012-09-05
  Administered 2012-09-03: 30 mL via ORAL
  Filled 2012-09-03: qty 30

## 2012-09-03 MED ORDER — POTASSIUM CHLORIDE CRYS ER 20 MEQ PO TBCR
40.0000 meq | EXTENDED_RELEASE_TABLET | Freq: Two times a day (BID) | ORAL | Status: DC
  Administered 2012-09-03 – 2012-09-05 (×4): 40 meq via ORAL
  Filled 2012-09-03 (×5): qty 2

## 2012-09-03 MED ORDER — HYDROCODONE-ACETAMINOPHEN 5-325 MG PO TABS
1.0000 | ORAL_TABLET | Freq: Four times a day (QID) | ORAL | Status: DC | PRN
  Administered 2012-09-04: 1 via ORAL
  Filled 2012-09-03 (×2): qty 1

## 2012-09-03 MED ORDER — LORATADINE 10 MG PO TABS
10.0000 mg | ORAL_TABLET | Freq: Every day | ORAL | Status: DC
  Administered 2012-09-03 – 2012-09-05 (×3): 10 mg via ORAL
  Filled 2012-09-03 (×4): qty 1

## 2012-09-03 MED ORDER — GABAPENTIN 100 MG PO CAPS
100.0000 mg | ORAL_CAPSULE | Freq: Two times a day (BID) | ORAL | Status: DC
  Administered 2012-09-03 – 2012-09-05 (×4): 100 mg via ORAL
  Filled 2012-09-03 (×6): qty 1

## 2012-09-03 NOTE — Progress Notes (Signed)
Pt daughter called upset that a wound care RN had not seen her mother yet. Explained to daughter that consult had been ordered this am and that she will be seen today. Daughter stated that her mother's legs have been swollen for months and they are tired of not getting any results. Talked to daughter about heart failure and that this leg problem is from swelling from edema and that her mother's legs have 3 plus edema causing blistering and weeping . Elevating pt's legs whether in bed or recliner today.

## 2012-09-03 NOTE — Progress Notes (Signed)
Pt stated she was to have dermatology appointment today. Called and cancelled her appointment due to hospitalization. Asked about a wound nurse to access lower leg swelling and blisters. . Dr ordered a consult for pt to be seen.

## 2012-09-03 NOTE — Progress Notes (Signed)
Subjective:  Feeling better. 854 ml negative fluid balance. Afebrile.   Objective:  Vital Signs in the last 24 hours: Temp:  [97.5 F (36.4 C)-98.5 F (36.9 C)] 98.5 F (36.9 C) (02/05 0510) Pulse Rate:  [52-67] 59  (02/05 0613) Cardiac Rhythm:  [-] Sinus bradycardia (02/05 0745) Resp:  [18-20] 18  (02/05 0510) BP: (115-168)/(50-79) 130/56 mmHg (02/05 1011) SpO2:  [96 %-97 %] 96 % (02/05 0510) Weight:  [99.882 kg (220 lb 3.2 oz)] 99.882 kg (220 lb 3.2 oz) (02/05 0404)  Physical Exam: BP Readings from Last 1 Encounters:  09/03/12 130/56    Wt Readings from Last 1 Encounters:  09/03/12 99.882 kg (220 lb 3.2 oz)    Weight change:   HEENT: [place]/AT, Eyes-Brown, PERL, EOMI, Conjunctiva-Pale pink, Sclera-Non-icteric Neck: No JVD, No bruit, Trachea midline. Lungs:  Clearing, Bilateral. Cardiac:  Regular rhythm, normal S1 and S2, no S3.  Abdomen:  Soft, non-tender. Extremities:  2 + edema present. No cyanosis. No clubbing. CNS: AxOx3, Cranial nerves grossly intact, moves all 4 extremities. Right handed. Skin: Warm and dry.   Intake/Output from previous day: 02/04 0701 - 02/05 0700 In: 446 [P.O.:440; IV Piggyback:6] Out: 1300 [Urine:1300]    Lab Results: BMET    Component Value Date/Time   NA 142 09/03/2012 0453   K 3.9 09/03/2012 0453   CL 105 09/03/2012 0453   CO2 32 09/03/2012 0453   GLUCOSE 156* 09/03/2012 0453   BUN 16 09/03/2012 0453   CREATININE 0.81 09/03/2012 0453   CALCIUM 8.4 09/03/2012 0453   GFRNONAA 68* 09/03/2012 0453   GFRAA 79* 09/03/2012 0453   CBC    Component Value Date/Time   WBC 7.9 09/02/2012 1422   RBC 3.74* 09/02/2012 1422   HGB 9.9* 09/02/2012 1422   HCT 30.9* 09/02/2012 1422   PLT 279 09/02/2012 1422   MCV 82.6 09/02/2012 1422   MCH 26.5 09/02/2012 1422   MCHC 32.0 09/02/2012 1422   RDW 15.0 09/02/2012 1422   LYMPHSABS 2.1 09/02/2012 1422   MONOABS 0.9 09/02/2012 1422   EOSABS 0.2 09/02/2012 1422   BASOSABS 0.0 09/02/2012 1422   CARDIAC ENZYMES Lab Results  Component Value  Date   CKTOTAL 204* 06/12/2012   CKMB 5.4* 06/12/2012   TROPONINI <0.30 06/11/2012    Scheduled Meds:   . carvedilol  3.125 mg Oral BID WC  . cloNIDine  0.1 mg Oral BID  . doxepin  50 mg Oral Daily  . escitalopram  10 mg Oral Daily  . ferrous fumarate  1 tablet Oral BID  . fluticasone  2 spray Each Nare Daily  . furosemide  60 mg Intravenous BID  . gabapentin  100 mg Oral BID  . heparin  5,000 Units Subcutaneous Q8H  . isosorbide mononitrate  30 mg Oral Daily  . lisinopril  20 mg Oral Daily  . loratadine  10 mg Oral Daily  . mometasone-formoterol  2 puff Inhalation BID  . pantoprazole  40 mg Oral Q1200  . potassium chloride SA  40 mEq Oral BID  . sodium chloride  3 mL Intravenous Q12H   Continuous Infusions:  PRN Meds:.sodium chloride, acetaminophen, HYDROcodone-acetaminophen, magnesium hydroxide, ondansetron (ZOFRAN) IV, sodium chloride  Assessment/Plan:  Patient Active Hospital Problem List:  Acute left heart diastolic failure  Hypertension  Bilateral lower extremity edema  Chronic anemia  COPD  Gout  Hypokalemia-resolved Morbid Obesity  Apply ted hose. (Leg edema more than congestive heart failure could explain)   LOS: 1 day  Orpah Cobb  MD  09/03/2012, 6:39 PM

## 2012-09-03 NOTE — Progress Notes (Signed)
UR Chart Review Completed  

## 2012-09-03 NOTE — Clinical Documentation Improvement (Signed)
BMI DOCUMENTATION CLARIFICATION QUERY  THIS DOCUMENT IS NOT A PERMANENT PART OF THE MEDICAL RECORD  TO RESPOND TO THE THIS QUERY, FOLLOW THE INSTRUCTIONS BELOW:  1. If needed, update documentation for the patient's encounter via the notes activity.  2. Access this query again and click edit on the In [employer].  3. After updating, or not, click F2 to complete all highlighted (required) fields concerning your review. Select "additional documentation in the medical record" OR "no additional documentation provided".  4. Click Sign note button.  5. The deficiency will fall out of your In Basket *Please let us know if you are not able to complete this workflow by phone or e-mail (listed below).         09/03/12  Dear Dr.  Algie Coffer  Marton Redwood  In an effort to better capture your patient's severity of illness/SOI, risk of mortality/ROM, reflect appropriate length of stay and utilization of resources, a review of the patient medical record has revealed the following indicators.   PLEASE ADDRESS ABNORMAL FINDING IN NOTES AND DC SUMMARY TO CLARIFY PATIENT'S SEVERITY OF ILLNESS AND RISK OF MORTALITY. THANK YOU.  Possible Clinical conditions - Morbid Obesity=  BMI > or = 40.0 - Other Condition (please specify)  Supporting Information - BMI = 43.4 per 2/4 flowsheet   Reviewed: Will wait for edema fluid to come out and get better weight before adding morbid obesity.  Thank You,  Beverley Fiedler RN BSN Clinical Documentation Specialist: Tele:  [phone]  Health Information Management [place]

## 2012-09-03 NOTE — Progress Notes (Signed)
Echocardiogram 2D Echocardiogram has been performed.  Monique Lewis 09/03/2012, 2:56 PM

## 2012-09-03 NOTE — Consult Note (Signed)
WOC consult Note Reason for Consult: Consult requested for bilat legs.  Pt with generalized edema.  No open wounds.  Posterior legs with few patchy areas of partial thickness skin loss where pt states she scratched them prior to admission.  Few scattered clear, fluid filled blisters.  Mod yellow drainage.  Pt states her legs are "burning" all the time.  Attempted to educate her on heart failure and treatment of fluid overload with diuretics, explained that the burning is probably R/T the tightly stretched skin.  She does not appear receptive to teaching, keeps saying, "why don't they give me antibiotics?  Can't you put some type of lotion on to help with the burning?"   Dressing procedure/placement/frequency: Foam dressings to protect and absorb drainage. Will not plan to follow further unless re-consulted.  [address], RN, MSN, [employer]  [phone]

## 2012-09-04 LAB — BASIC METABOLIC PANEL
Chloride: 106 mEq/L (ref 96–112)
GFR calc Af Amer: 74 mL/min — ABNORMAL LOW (ref >90)
Potassium: 4.9 mEq/L (ref 3.5–5.1)
Sodium: 142 mEq/L (ref 135–145)

## 2012-09-04 LAB — CBC
HCT: 30.5 % — ABNORMAL LOW (ref 36.0–46.0)
Hemoglobin: 9.8 g/dL — ABNORMAL LOW (ref 12.0–15.0)
RBC: 3.66 MIL/uL — ABNORMAL LOW (ref 3.87–5.11)
WBC: 6.8 10*3/uL (ref 4.0–10.5)

## 2012-09-04 NOTE — Progress Notes (Signed)
Subjective:  Feeling better. No chest pain. Decreasing leg edema.  Objective:  Vital Signs in the last 24 hours: Temp:  [97.7 F (36.5 C)-98.6 F (37 C)] 97.7 F (36.5 C) (02/06 1425) Pulse Rate:  [53-102] 56  (02/06 1425) Cardiac Rhythm:  [-] Normal sinus rhythm (02/06 0830) Resp:  [18-20] 18  (02/06 1425) BP: (105-144)/(49-82) 105/63 mmHg (02/06 1425) SpO2:  [93 %-99 %] 98 % (02/06 1425) Weight:  [98.068 kg (216 lb 3.2 oz)] 98.068 kg (216 lb 3.2 oz) (02/06 0436)  Physical Exam: BP Readings from Last 1 Encounters:  09/04/12 105/63    Wt Readings from Last 1 Encounters:  09/04/12 98.068 kg (216 lb 3.2 oz)    Weight change: -2.586 kg (-5 lb 11.2 oz)  HEENT: [place]/AT, Eyes-Brown, PERL, EOMI, Conjunctiva-Pale pink, Sclera-Non-icteric Neck: No JVD, No bruit, Trachea midline. Lungs:  Clear, Bilateral. Cardiac:  Regular rhythm, normal S1 and S2, no S3.  Abdomen:  Soft, non-tender. Extremities:  1-2 + edema present. No cyanosis. No clubbing. Scar of surgery to knees. CNS: AxOx3, Cranial nerves grossly intact, moves all 4 extremities. Right handed. Skin: Warm and dry.   Intake/Output from previous day: 02/05 0701 - 02/06 0700 In: 1166 [P.O.:1160; IV Piggyback:6] Out: 1052 [Urine:1050; Stool:2]    Lab Results: BMET    Component Value Date/Time   NA 142 09/04/2012 0550   K 4.9 09/04/2012 0550   CL 106 09/04/2012 0550   CO2 31 09/04/2012 0550   GLUCOSE 137* 09/04/2012 0550   BUN 16 09/04/2012 0550   CREATININE 0.86 09/04/2012 0550   CALCIUM 8.7 09/04/2012 0550   GFRNONAA 64* 09/04/2012 0550   GFRAA 74* 09/04/2012 0550   CBC    Component Value Date/Time   WBC 6.8 09/04/2012 0550   RBC 3.66* 09/04/2012 0550   HGB 9.8* 09/04/2012 0550   HCT 30.5* 09/04/2012 0550   PLT 243 09/04/2012 0550   MCV 83.3 09/04/2012 0550   MCH 26.8 09/04/2012 0550   MCHC 32.1 09/04/2012 0550   RDW 15.5 09/04/2012 0550   LYMPHSABS 2.1 09/02/2012 1422   MONOABS 0.9 09/02/2012 1422   EOSABS 0.2 09/02/2012 1422   BASOSABS 0.0 09/02/2012  1422   CARDIAC ENZYMES Lab Results  Component Value Date   CKTOTAL 204* 06/12/2012   CKMB 5.4* 06/12/2012   TROPONINI <0.30 06/11/2012    Scheduled Meds:   . carvedilol  3.125 mg Oral BID WC  . cloNIDine  0.1 mg Oral BID  . doxepin  50 mg Oral Daily  . escitalopram  10 mg Oral Daily  . ferrous fumarate  1 tablet Oral BID  . fluticasone  2 spray Each Nare Daily  . furosemide  60 mg Intravenous BID  . gabapentin  100 mg Oral BID  . heparin  5,000 Units Subcutaneous Q8H  . isosorbide mononitrate  30 mg Oral Daily  . lisinopril  20 mg Oral Daily  . loratadine  10 mg Oral Daily  . mometasone-formoterol  2 puff Inhalation BID  . pantoprazole  40 mg Oral Q1200  . potassium chloride SA  40 mEq Oral BID  . sodium chloride  3 mL Intravenous Q12H   Continuous Infusions:  PRN Meds:.sodium chloride, acetaminophen, HYDROcodone-acetaminophen, magnesium hydroxide, ondansetron (ZOFRAN) IV, sodium chloride  Assessment/Plan:  Patient Active Hospital Problem List: Acute left heart diastolic failure  Hypertension  Bilateral lower extremity edema  Chronic anemia  COPD  Gout  Hypokalemia-resolved  Morbid Obesity  Increase activity. Home soon.    LOS:  2 days    Orpah Cobb  MD  09/04/2012, 2:28 PM

## 2012-09-05 LAB — BASIC METABOLIC PANEL
BUN: 21 mg/dL (ref 6–23)
Chloride: 104 mEq/L (ref 96–112)
GFR calc Af Amer: 54 mL/min — ABNORMAL LOW (ref >90)
GFR calc non Af Amer: 47 mL/min — ABNORMAL LOW (ref >90)
Potassium: 4.8 mEq/L (ref 3.5–5.1)
Sodium: 141 mEq/L (ref 135–145)

## 2012-09-05 LAB — GLUCOSE, CAPILLARY
Glucose-Capillary: 121 mg/dL — ABNORMAL HIGH (ref 70–99)
Glucose-Capillary: 113 mg/dL — ABNORMAL HIGH (ref 70–99)

## 2012-09-05 MED ORDER — HYDRALAZINE HCL 25 MG PO TABS
25.0000 mg | ORAL_TABLET | Freq: Four times a day (QID) | ORAL | Status: DC
  Administered 2012-09-05 (×2): 25 mg via ORAL
  Filled 2012-09-05 (×4): qty 1

## 2012-09-05 MED ORDER — HYDRALAZINE HCL 25 MG PO TABS: 25.0000 mg | ORAL_TABLET | Freq: Four times a day (QID) | ORAL | Status: DC

## 2012-09-05 MED ORDER — ISOSORBIDE MONONITRATE ER 30 MG PO TB24: 30.0000 mg | ORAL_TABLET | Freq: Every day | ORAL | Status: DC

## 2012-09-05 MED ORDER — POTASSIUM CHLORIDE CRYS ER 20 MEQ PO TBCR: 20.0000 meq | EXTENDED_RELEASE_TABLET | Freq: Two times a day (BID) | ORAL | Status: AC

## 2012-09-05 NOTE — Evaluation (Signed)
Physical Therapy Evaluation Patient Details Name: Monique Lewis MRN: [id number] DOB: [birth year] Today's Date: 09/05/2012 Time: 4098-1191 PT Time Calculation (min): 29 min  PT Assessment / Plan / Recommendation Clinical Impression  Pt is a 79 yo female admitted for bilat LE edema. Pt lived alone with PCA 3 hr M-F. Pt functioning close to baseline and safe to d/c home with use of RW and con't PCA services. Pt to benefit from home health safety PT eval to ensure pt's apartment is optimal for safe function.    PT Assessment  Patient needs continued PT services    Follow Up Recommendations  Home health PT    Does the patient have the potential to tolerate intense rehabilitation      Barriers to Discharge        Equipment Recommendations  Rolling walker with 5" wheels    Recommendations for Other Services     Frequency Min 2X/week    Precautions / Restrictions Precautions Precautions: None Restrictions Weight Bearing Restrictions: No   Pertinent Vitals/Pain Reports LE soreness      Mobility  Bed Mobility Bed Mobility: Not assessed (due to pt up in chair upon arrival) Transfers Transfers: Sit to Stand;Stand to Sit;Stand Pivot Transfers Sit to Stand: 5: Supervision;With upper extremity assist;With armrests;From chair/3-in-1 Stand to Sit: 5: Supervision;With upper extremity assist;To chair/3-in-1 Stand Pivot Transfers: 5: Supervision (with RW) Details for Transfer Assistance: pt with good technique Ambulation/Gait Ambulation/Gait Assistance: 5: Supervision Ambulation Distance (Feet): 150 Feet Assistive device: Rolling walker Ambulation/Gait Assistance Details: no LOB, fluid gait pattern despite edema in bilat LEs. safe with both RW and no AD. RW beneficial for longer distances for energy conservations Gait Pattern: Within Functional Limits Gait velocity: wfl Stairs: No    Exercises     PT Diagnosis: Generalized weakness  PT Problem List: Decreased activity  tolerance PT Treatment Interventions:     PT Goals Acute Rehab PT Goals PT Goal Formulation: With patient Time For Goal Achievement: 09/12/12 Potential to Achieve Goals: Good Pt will go Supine/Side to Sit: Independently;with HOB 0 degrees PT Goal: Supine/Side to Sit - Progress: Goal set today Pt will go Sit to Supine/Side: Independently;with HOB 0 degrees PT Goal: Sit to Supine/Side - Progress: Goal set today Pt will go Sit to Stand: Independently;with upper extremity assist PT Goal: Sit to Stand - Progress: Goal set today Pt will Ambulate: >150 feet;with modified independence;with least restrictive assistive device PT Goal: Ambulate - Progress: Goal set today  Visit Information  Last PT Received On: 09/05/12 Assistance Needed: +1    Subjective Data  Subjective: Pt received sitting up in chair with report "I'm wet. I'm on lasiks and no-one came to help me to the commode."   Prior Functioning  Home Living Lives With: Alone Available Help at Discharge: Personal care attendant (3 hours daily M-F) Type of Home: Apartment (senior building) Home Access: Elevator Home Layout: One level Bathroom Shower/Tub: Engineer, manufacturing systems: Standard Bathroom Accessibility: Yes How Accessible: Accessible via walker Home Adaptive Equipment: Walker - standard Prior Function Level of Independence: Needs assistance Needs Assistance: Bathing;Meal Prep;Light Housekeeping Bath: Minimal Meal Prep: Minimal Light Housekeeping: Moderate Able to Take Stairs?: No Driving: No Vocation: Retired Musician: No difficulties Dominant Hand: Right    Cognition  Cognition Overall Cognitive Status: Appears within functional limits for tasks assessed/performed Arousal/Alertness: Awake/alert Orientation Level: Appears intact for tasks assessed Behavior During Session: [facility] for tasks performed    Extremity/Trunk Assessment Right Upper Extremity Assessment RUE ROM/Strength/Tone:  Within functional levels Left Upper Extremity Assessment LUE ROM/Strength/Tone: WFL for tasks assessed Right Lower Extremity Assessment RLE ROM/Strength/Tone: [facility] for tasks assessed (noted edema) Left Lower Extremity Assessment LLE ROM/Strength/Tone: WFL for tasks assessed (noted edema) Trunk Assessment Trunk Assessment: Normal   Balance Balance Balance Assessed: Yes Dynamic Standing Balance Dynamic Standing - Balance Support: Right upper extremity supported Dynamic Standing - Level of Assistance: 6: Modified independent (Device/Increase time) Dynamic Standing - Balance Activities:  (pt able to perform peri-care post urination) Dynamic Standing - Comments: no LOB  End of Session PT - End of Session Equipment Utilized During Treatment: Gait belt Activity Tolerance: Patient tolerated treatment well Patient left: in chair;with call bell/phone within reach (LEs elevated) Nurse Communication: Mobility status  GP     Marcene Brawn 09/05/2012, 12:18 PM  Lewis Shock, PT, DPT Pager #: [phone] Office #: [phone]

## 2012-09-05 NOTE — Care Management Note (Signed)
    Page 1 of 2   09/05/2012     10:44:10 AM   CARE MANAGEMENT NOTE 09/05/2012  Patient:  KALE, DOLS   Account Number:  [id number]  Date Initiated:  09/05/2012  Documentation initiated by:  [facility]  Subjective/Objective Assessment:   79 years old female with 10 pounds of weight gain and bilateral leg edema had shortness of breath x 1 week. She responded to IV lasix and vasodilators along with application of [facility]. Her hypokalemia was corrected with K+ supplement     Action/Plan:   She was discharged home in satisfactory condition with instructions to keep leg elevated, apply Ted hose, decrease salt and fluid intake and weigh daily. Her clonidine was discontinued for sinus bradycardia.  Lives at home alone, has BSC.   Anticipated DC Date:  09/05/2012   Anticipated DC Plan:  HOME W HOME HEALTH SERVICES      DC Planning Services  CM consult      [facility] Choice  HOME HEALTH   Choice offered to / List presented to:  C-1 Patient        HH arranged  HH-1 RN  HH-10 DISEASE MANAGEMENT      HH agency  [facility]   Status of service:  Completed, signed off Medicare Important Message given?   (If response is "NO", the following Medicare IM given date fields will be blank) Date Medicare IM given:   Date Additional Medicare IM given:    Discharge Disposition:  HOME W HOME HEALTH SERVICES  Per UR Regulation:  Reviewed for med. necessity/level of care/duration of stay  If discussed at Long Length of Stay Meetings, dates discussed:    Comments:  09/05/12 @ 1035.Marland KitchenMarland KitchenSpoke with pt regarding HH services for HF management.  Pt chose [facility] to render services.  Lupita Leash notified. Oletta Cohn, RN, [place]  [phone].

## 2012-09-05 NOTE — Progress Notes (Signed)
Patient evaluated for long-term disease management services with [facility] Care Management Program. Patient will receive a post discharge transition of care call and be evaluated for monthly home visits for assessments and for education. Met with patient at bedside who was encouraged by services that [facility] Care Management can provide. Patient mentions she has issues with transportation to her MD appointments. Lives alone. Consents signed at bedside.  Raiford Noble, MSN-Ed, RN,BSN [facility] Liaison [phone]

## 2012-09-05 NOTE — Discharge Summary (Signed)
Physician Discharge Summary  Patient ID: Monique Lewis MRN: [id number] DOB/AGE: 08/01/1932 79 y.o.  Admit date: 09/02/2012 Discharge date: 09/05/2012  Admission Diagnoses: Acute left heart diastolic failure  Hypertension  Bilateral lower extremity edema  Chronic anemia, iron deficiency  COPD  Gout  Hypokalemia-resolved  Morbid Obesity  Discharge Diagnoses:  Principle Problem: *  Acute left heart diastolic failure * Hypertension  Bilateral lower extremity edema  Chronic anemia, iron deficiency  COPD  Gout  Hypokalemia-resolved  Morbid Obesity  Discharged Condition: good  Hospital Course: 79 years old female with 10 pounds of weight gain and bilateral leg edema had shortness of breath x 1 week. She responded to IV lasix and vasodilators along with application of [facility]. Her hypokalemia was corrected with K+ supplement. She was discharged home in satisfactory condition with instructions to keep leg elevated, apply Ted hose, decrease salt and fluid intake and weigh daily. Her clonidine was discontinued for sinus bradycardia.  Consults: None  Significant Diagnostic Studies: labs: low Hgb of 9.9, normal WBC and platelets count. Normal electrolytes except low K+.   Cardiac graphics: Echocardiogram: LVH with diastolic dysfucntion, mild MR, dilated left atrium.  Treatments: cardiac meds: lisinopril (generic), carvedilol, potassium and furosemide  Discharge Exam: Blood pressure 179/68, pulse 55, temperature 98.1 F (36.7 C), temperature source Oral, resp. rate 19, height 5' (1.524 m), weight 96.344 kg (212 lb 6.4 oz), SpO2 97.00%.  General: Averagely developed and well nourished. Mild respiratory distress.  HEENT: [place]/AT, brown eyes, conj-pale pink, sclera-non-icteric. Tongue pale pink, mid-line.  Neck -No JVD.  Lungs: Clear but decrease entry at bases, bilaterally.  Heart: Normal S1 and S2. II/VI systolic murmur.  Abdomen: Soft, distendednon-tender.  Ext-1 + ankle and feet  edema with early varicose veins.  CNS: Cranial nerves grossly intact. Moves all 4 ext.  Skin:-Warm and dry  Disposition: 01-Home or Self Care     Medication List     As of 09/05/2012  9:36 AM    STOP taking these medications         cloNIDine 0.1 MG tablet   Commonly known as: CATAPRES      TAKE these medications         albuterol 108 (90 BASE) MCG/ACT inhaler   Commonly known as: PROVENTIL HFA;VENTOLIN HFA   Inhale 2 puffs into the lungs every 4 (four) hours as needed. For wheezing      budesonide-formoterol 160-4.5 MCG/ACT inhaler   Commonly known as: SYMBICORT   Inhale 2 puffs into the lungs 2 (two) times daily.      carvedilol 3.125 MG tablet   Commonly known as: COREG   Take 3.125 mg by mouth daily.      clobetasol cream 0.05 %   Commonly known as: TEMOVATE   Apply 1 application topically 2 (two) times daily.      doxepin 50 MG capsule   Commonly known as: SINEQUAN   Take 50 mg by mouth at bedtime.      escitalopram 10 MG tablet   Commonly known as: LEXAPRO   Take 10 mg by mouth daily.      esomeprazole 40 MG capsule   Commonly known as: NEXIUM   Take 40 mg by mouth daily before breakfast.      ferrous fumarate 325 (106 FE) MG Tabs   Commonly known as: HEMOCYTE - 106 mg FE   Take 1 tablet by mouth 2 (two) times daily.      fluticasone 50 MCG/ACT nasal spray  Commonly known as: FLONASE   Place 2 sprays into the nose daily.      Fluticasone-Salmeterol 250-50 MCG/DOSE Aepb   Commonly known as: ADVAIR   Inhale 1 puff into the lungs every 12 (twelve) hours.      furosemide 40 MG tablet   Commonly known as: LASIX   Take 1 tablet (40 mg total) by mouth daily.      hydrALAZINE 25 MG tablet   Commonly known as: APRESOLINE   Take 1 tablet (25 mg total) by mouth 4 (four) times daily.      hydrOXYzine 25 MG tablet   Commonly known as: ATARAX/VISTARIL   Take 25 mg by mouth every 4 (four) hours as needed. For itching      isosorbide mononitrate 30 MG 24  hr tablet   Commonly known as: IMDUR   Take 1 tablet (30 mg total) by mouth daily.      lisinopril 20 MG tablet   Commonly known as: PRINIVIL,ZESTRIL   Take 1 tablet (20 mg total) by mouth daily.      loratadine 10 MG tablet   Commonly known as: CLARITIN   Take 10 mg by mouth daily.      potassium chloride SA 20 MEQ tablet   Commonly known as: K-DUR,KLOR-CON   Take 1 tablet (20 mEq total) by mouth 2 (two) times daily.         SignedOrpah Cobb Lewis 09/05/2012, 9:36 AM

## 2012-09-05 NOTE — Progress Notes (Signed)
MORNING DOSE OF COREG HELD D/T RATE IN THE 40'S DURING SLEEP. MD ON CALL PAGED, AWAITING PARAMETERS. WILL CONTINUE TO MONITOR. Troy Sine

## 2012-09-05 NOTE — Progress Notes (Signed)
1445 ppt complaint of feeling dizzy with  Epigastric discomfort non radiating . BS cbg done 113 skin warm and dry touch vs done and posted . Made a call to Dr. Algie Coffer . Dr Algie Coffer responded  1529 Ekg done . Seen by Dr. Algie Coffer . Ekg reviewed by same . To give force fluids by mouth  As instructed  And pt monitored   !600 pt feeling better and vs done . Made a call to Dr. Algie Coffer  Updated with pt's status . Will reevaluate pt the patient informed   1700 seen by Dr. Theadore Nan to go home . Pt aware Awaiting for daughter to listen for discharge instructions.

## 2012-12-09 ENCOUNTER — Encounter: Payer: Self-pay | Admitting: *Deleted

## 2012-12-10 ENCOUNTER — Encounter: Payer: Self-pay | Admitting: *Deleted

## 2012-12-10 ENCOUNTER — Ambulatory Visit: Payer: Self-pay | Admitting: Internal Medicine

## 2012-12-30 ENCOUNTER — Ambulatory Visit: Payer: Self-pay | Admitting: Internal Medicine

## 2014-07-06 ENCOUNTER — Encounter (HOSPITAL_COMMUNITY): Payer: Self-pay | Admitting: General Practice

## 2014-07-06 ENCOUNTER — Inpatient Hospital Stay (HOSPITAL_COMMUNITY): Payer: Medicare Other

## 2014-07-06 ENCOUNTER — Inpatient Hospital Stay (HOSPITAL_COMMUNITY)
Admission: AD | Admit: 2014-07-06 | Discharge: 2014-07-08 | DRG: 292 | Disposition: A | Discharge status: Home / Self Care | Payer: Medicare Other | Source: Ambulatory Visit | Attending: Cardiovascular Disease | Admitting: Cardiovascular Disease

## 2014-07-06 DIAGNOSIS — E6609 Other obesity due to excess calories: Secondary | ICD-10-CM | POA: Diagnosis present

## 2014-07-06 DIAGNOSIS — Z888 Allergy status to other drugs, medicaments and biological substances status: Secondary | ICD-10-CM

## 2014-07-06 DIAGNOSIS — R0602 Shortness of breath: Secondary | ICD-10-CM | POA: Diagnosis present

## 2014-07-06 DIAGNOSIS — J449 Chronic obstructive pulmonary disease, unspecified: Secondary | ICD-10-CM | POA: Diagnosis present

## 2014-07-06 DIAGNOSIS — Z6841 Body Mass Index (BMI) 40.0 and over, adult: Secondary | ICD-10-CM

## 2014-07-06 DIAGNOSIS — I10 Essential (primary) hypertension: Secondary | ICD-10-CM | POA: Diagnosis present

## 2014-07-06 DIAGNOSIS — M545 Low back pain: Secondary | ICD-10-CM | POA: Diagnosis present

## 2014-07-06 DIAGNOSIS — Z91040 Latex allergy status: Secondary | ICD-10-CM

## 2014-07-06 DIAGNOSIS — Z96653 Presence of artificial knee joint, bilateral: Secondary | ICD-10-CM | POA: Diagnosis present

## 2014-07-06 DIAGNOSIS — K219 Gastro-esophageal reflux disease without esophagitis: Secondary | ICD-10-CM | POA: Diagnosis present

## 2014-07-06 DIAGNOSIS — R600 Localized edema: Secondary | ICD-10-CM | POA: Diagnosis present

## 2014-07-06 DIAGNOSIS — I5031 Acute diastolic (congestive) heart failure: Secondary | ICD-10-CM | POA: Diagnosis present

## 2014-07-06 DIAGNOSIS — J45909 Unspecified asthma, uncomplicated: Secondary | ICD-10-CM | POA: Diagnosis present

## 2014-07-06 DIAGNOSIS — M109 Gout, unspecified: Secondary | ICD-10-CM | POA: Diagnosis present

## 2014-07-06 DIAGNOSIS — Z882 Allergy status to sulfonamides status: Secondary | ICD-10-CM

## 2014-07-06 DIAGNOSIS — D6489 Other specified anemias: Secondary | ICD-10-CM | POA: Diagnosis present

## 2014-07-06 DIAGNOSIS — G8929 Other chronic pain: Secondary | ICD-10-CM | POA: Diagnosis present

## 2014-07-06 DIAGNOSIS — B1920 Unspecified viral hepatitis C without hepatic coma: Secondary | ICD-10-CM | POA: Diagnosis present

## 2014-07-06 HISTORY — DX: Family history of other specified conditions: Z84.89

## 2014-07-06 LAB — COMPREHENSIVE METABOLIC PANEL
ALT: 28 U/L (ref 0–35)
ANION GAP: 13 (ref 5–15)
AST: 38 U/L — ABNORMAL HIGH (ref 0–37)
Albumin: 3.4 g/dL — ABNORMAL LOW (ref 3.5–5.2)
Alkaline Phosphatase: 92 U/L (ref 39–117)
BILIRUBIN TOTAL: 0.3 mg/dL (ref 0.3–1.2)
BUN: 31 mg/dL — AB (ref 6–23)
CHLORIDE: 99 meq/L (ref 96–112)
CO2: 28 mEq/L (ref 19–32)
CREATININE: 1.06 mg/dL (ref 0.50–1.10)
Calcium: 9.5 mg/dL (ref 8.4–10.5)
GFR calc non Af Amer: 49 mL/min — ABNORMAL LOW (ref >90)
GFR, EST AFRICAN AMERICAN: 57 mL/min — AB (ref >90)
Glucose, Bld: 93 mg/dL (ref 70–99)
Potassium: 3.9 mEq/L (ref 3.7–5.3)
Sodium: 140 mEq/L (ref 137–147)
Total Protein: 7.7 g/dL (ref 6.0–8.3)

## 2014-07-06 LAB — CBC WITH DIFFERENTIAL/PLATELET
BASOS ABS: 0 10*3/uL (ref 0.0–0.1)
Basophils Relative: 0 % (ref 0–1)
Eosinophils Absolute: 0.2 10*3/uL (ref 0.0–0.7)
Eosinophils Relative: 2 % (ref 0–5)
HCT: 28.2 % — ABNORMAL LOW (ref 36.0–46.0)
Hemoglobin: 8.5 g/dL — ABNORMAL LOW (ref 12.0–15.0)
LYMPHS ABS: 2.5 10*3/uL (ref 0.7–4.0)
LYMPHS PCT: 32 % (ref 12–46)
MCH: 22.1 pg — ABNORMAL LOW (ref 26.0–34.0)
MCHC: 30.1 g/dL (ref 30.0–36.0)
MCV: 73.2 fL — ABNORMAL LOW (ref 78.0–100.0)
MONOS PCT: 12 % (ref 3–12)
Monocytes Absolute: 0.9 10*3/uL (ref 0.1–1.0)
Neutro Abs: 4.1 10*3/uL (ref 1.7–7.7)
Neutrophils Relative %: 54 % (ref 43–77)
PLATELETS: 294 10*3/uL (ref 150–400)
RBC: 3.85 MIL/uL — ABNORMAL LOW (ref 3.87–5.11)
RDW: 20.3 % — AB (ref 11.5–15.5)
WBC: 7.7 10*3/uL (ref 4.0–10.5)

## 2014-07-06 LAB — TROPONIN I: Troponin I: <0.3 ng/mL (ref <0.30)

## 2014-07-06 LAB — TSH: TSH: 4.3 u[IU]/mL (ref 0.350–4.500)

## 2014-07-06 LAB — PRO B NATRIURETIC PEPTIDE: Pro B Natriuretic peptide (BNP): 184.5 pg/mL (ref 0–450)

## 2014-07-06 MED ORDER — POTASSIUM CHLORIDE CRYS ER 20 MEQ PO TBCR
20.0000 meq | EXTENDED_RELEASE_TABLET | Freq: Two times a day (BID) | ORAL | Status: DC
Start: 2014-07-06 — End: 2014-07-08
  Administered 2014-07-06 – 2014-07-08 (×5): 20 meq via ORAL
  Filled 2014-07-06 (×6): qty 1

## 2014-07-06 MED ORDER — ESCITALOPRAM OXALATE 10 MG PO TABS
10.0000 mg | ORAL_TABLET | Freq: Every day | ORAL | Status: DC
  Administered 2014-07-06 – 2014-07-08 (×3): 10 mg via ORAL
  Filled 2014-07-06 (×3): qty 1

## 2014-07-06 MED ORDER — CLONIDINE HCL 0.1 MG PO TABS
0.1000 mg | ORAL_TABLET | Freq: Two times a day (BID) | ORAL | Status: DC
  Administered 2014-07-06 – 2014-07-08 (×4): 0.1 mg via ORAL
  Filled 2014-07-06 (×7): qty 1

## 2014-07-06 MED ORDER — GLIPIZIDE ER 5 MG PO TB24
5.0000 mg | ORAL_TABLET | Freq: Every day | ORAL | Status: DC
  Administered 2014-07-07 – 2014-07-08 (×2): 5 mg via ORAL
  Filled 2014-07-06 (×2): qty 1

## 2014-07-06 MED ORDER — PANTOPRAZOLE SODIUM 40 MG PO TBEC
40.0000 mg | DELAYED_RELEASE_TABLET | Freq: Every day | ORAL | Status: DC
  Administered 2014-07-06 – 2014-07-08 (×3): 40 mg via ORAL
  Filled 2014-07-06 (×3): qty 1

## 2014-07-06 MED ORDER — FLUTICASONE PROPIONATE 50 MCG/ACT NA SUSP
2.0000 | Freq: Every day | NASAL | Status: DC
  Administered 2014-07-06 – 2014-07-08 (×3): 2 via NASAL
  Filled 2014-07-06: qty 16

## 2014-07-06 MED ORDER — SODIUM CHLORIDE 0.9 % IJ SOLN
3.0000 mL | Freq: Two times a day (BID) | INTRAMUSCULAR | Status: DC
  Administered 2014-07-06 – 2014-07-08 (×5): 3 mL via INTRAVENOUS

## 2014-07-06 MED ORDER — FERROUS FUMARATE 325 (106 FE) MG PO TABS
1.0000 | ORAL_TABLET | Freq: Two times a day (BID) | ORAL | Status: DC
  Administered 2014-07-06 – 2014-07-07 (×4): 106 mg via ORAL
  Filled 2014-07-06 (×7): qty 1

## 2014-07-06 MED ORDER — DOXEPIN HCL 50 MG PO CAPS
50.0000 mg | ORAL_CAPSULE | Freq: Every day | ORAL | Status: DC
  Administered 2014-07-06 – 2014-07-07 (×2): 50 mg via ORAL
  Filled 2014-07-06 (×4): qty 1

## 2014-07-06 MED ORDER — FUROSEMIDE 10 MG/ML IJ SOLN
40.0000 mg | Freq: Two times a day (BID) | INTRAMUSCULAR | Status: DC
  Administered 2014-07-06 – 2014-07-08 (×4): 40 mg via INTRAVENOUS
  Filled 2014-07-06 (×6): qty 4

## 2014-07-06 MED ORDER — CARVEDILOL 3.125 MG PO TABS
3.1250 mg | ORAL_TABLET | Freq: Every day | ORAL | Status: DC
  Administered 2014-07-07 – 2014-07-08 (×2): 3.125 mg via ORAL
  Filled 2014-07-06 (×3): qty 1

## 2014-07-06 MED ORDER — LORATADINE 10 MG PO TABS
10.0000 mg | ORAL_TABLET | Freq: Every day | ORAL | Status: DC
  Administered 2014-07-06 – 2014-07-08 (×3): 10 mg via ORAL
  Filled 2014-07-06 (×3): qty 1

## 2014-07-06 MED ORDER — BUDESONIDE-FORMOTEROL FUMARATE 160-4.5 MCG/ACT IN AERO
2.0000 | INHALATION_SPRAY | Freq: Two times a day (BID) | RESPIRATORY_TRACT | Status: DC
  Administered 2014-07-06 – 2014-07-08 (×4): 2 via RESPIRATORY_TRACT
  Filled 2014-07-06: qty 6

## 2014-07-06 MED ORDER — HEPARIN SODIUM (PORCINE) 5000 UNIT/ML IJ SOLN
5000.0000 [IU] | Freq: Three times a day (TID) | INTRAMUSCULAR | Status: DC
  Administered 2014-07-06 – 2014-07-08 (×7): 5000 [IU] via SUBCUTANEOUS
  Filled 2014-07-06 (×9): qty 1

## 2014-07-06 MED ORDER — LISINOPRIL 20 MG PO TABS
20.0000 mg | ORAL_TABLET | Freq: Every day | ORAL | Status: DC
  Administered 2014-07-06 – 2014-07-08 (×3): 20 mg via ORAL
  Filled 2014-07-06 (×3): qty 1

## 2014-07-06 MED ORDER — TRAMADOL HCL 50 MG PO TABS
50.0000 mg | ORAL_TABLET | Freq: Two times a day (BID) | ORAL | Status: DC
  Administered 2014-07-06 – 2014-07-07 (×3): 50 mg via ORAL
  Filled 2014-07-06 (×4): qty 1

## 2014-07-06 MED ORDER — HYDROXYZINE HCL 25 MG PO TABS
25.0000 mg | ORAL_TABLET | ORAL | Status: DC | PRN
  Filled 2014-07-06: qty 1

## 2014-07-06 MED ORDER — ALBUTEROL SULFATE (2.5 MG/3ML) 0.083% IN NEBU: 2.5000 mg | INHALATION_SOLUTION | RESPIRATORY_TRACT | Status: DC | PRN

## 2014-07-06 NOTE — Plan of Care (Signed)
Problem: Consults Goal: Diabetes Guidelines if Diabetic/Glucose > 140 If diabetic or lab glucose is > 140 mg/dl - Initiate Diabetes/Hyperglycemia Guidelines & Document Interventions  Outcome: Not Applicable Date Met:  14/64/31

## 2014-07-06 NOTE — Progress Notes (Signed)
UR Completed Lynesha Bango Graves-Bigelow, RN,BSN [phone]  

## 2014-07-06 NOTE — Care Management Note (Unsigned)
    Page 1 of 1   07/06/2014     4:29:40 PM CARE MANAGEMENT NOTE 07/06/2014  Patient:  Monique Lewis,Monique Lewis   Account Number:  [id number]  Date Initiated:  07/06/2014  Documentation initiated by:  GRAVES-BIGELOW,Joyce Heitman  Subjective/Objective Assessment:   Pt admitted for Leg edema and shortness of breath. 14 pounds of weight gain in 1 month and bilateral leg edema with shortness of breath x 1 week.     Action/Plan:   [facility] Pt has used [facility] in the past.   Anticipated DC Date:  07/08/2014   Anticipated DC Plan:  HOME W HOME HEALTH SERVICES      DC Planning Services  CM consult      Choice offered to / List presented to:             Status of service:  In process, will continue to follow Medicare Important Message given?   (If response is "NO", the following Medicare IM given date fields will be blank) Date Medicare IM given:   Medicare IM given by:   Date Additional Medicare IM given:   Additional Medicare IM given by:    Discharge Disposition:    Per UR Regulation:  Reviewed for med. necessity/level of care/duration of stay  If discussed at Long Length of Stay Meetings, dates discussed:    Comments:

## 2014-07-06 NOTE — Progress Notes (Signed)
Pt arrived to the unit as a direct admit; pt vitals taken; placed on telemetry called in to CCMD; oriented to the room and unit; IV team consult placed for IV access; Dr. Algie CofferKadakia called and notified of pt arrival to the unit; pain assessed. Pt noted to have rash black appearance and raised but don't appear to have fluid filled or blister looking to her buttocks more to the right buttock cheek; pt report her PCP gave her some medication which she takes for it. Foam dsg applied for barrier protection. Pt in bed comfortably with call light within reach. Will continue to monitor pt quietly. Monique MerlesP. Amo Shandreka Dante RN.

## 2014-07-06 NOTE — H&P (Signed)
Referring Physician:  Mariam Dollarlsie M Lewis is an 80 y.o. female.                       Chief Complaint: Leg edema and shortness of breath.  HPI: 80 years old female with 14 pounds of weight gain in 1 month and bilateral leg edema with shortness of breath x 1 week. No chest pain. Admits to salt food intake.  Past Medical History  Diagnosis Date  . Hypertension   . COPD (chronic obstructive pulmonary disease)   . HTN (hypertension) 06/10/2012  . GERD (gastroesophageal reflux disease) 06/10/2012  . Depression 06/10/2012  . Hepatitis-C 06/10/2012    Hx of Hep C after knee surgery 2001 s/p treatment per patient.  . Gout 06/10/2012  . Asthma 06/10/2012  . CHF (congestive heart failure)   . Heart murmur   . Anginal pain   . Pneumonia ~ 2001  . Exertional dyspnea   . Iron deficiency anemia 06/10/2012  . History of blood transfusion     "several; first one I caught Hepatitis" (06/10/12)  . Headache(784.0)     "severe last 3 wk" (06/10/12)  . Arthritis     "all over my body" (06/10/12)  . Chronic lower back pain   . Recurrent UTI (urinary tract infection)     "get them maybe q 6 months" (06/10/12)  . Allergy   . Hyperpotassemia       Past Surgical History  Procedure Laterality Date  . Cholecystectomy  2006  . Tonsillectomy and adenoidectomy  1940's  . Dilation and curettage of uterus    . Joint replacement      s/p 5 knee replacements  . Total knee arthroplasty  2001 - 2011    "5 total; have had them done on both knees" (06/10/12)    No family history on file. Social History:  reports that she has never smoked. She has never used smokeless tobacco. She reports that she does not drink alcohol or use illicit drugs.  Allergies:  Allergies  Allergen Reactions  . Celebrex [Celecoxib] Itching  . Sulfa Antibiotics Itching  . Codeine Itching  . Shellfish Allergy Itching  . Iodine Rash  . Latex Rash    Medications Prior to Admission  Medication Sig Dispense Refill  . albuterol  (PROVENTIL HFA;VENTOLIN HFA) 108 (90 BASE) MCG/ACT inhaler Inhale 2 puffs into the lungs every 4 (four) hours as needed. For wheezing    . AZOR 10-40 MG per tablet Take 1 tablet by mouth daily.    . budesonide-formoterol (SYMBICORT) 160-4.5 MCG/ACT inhaler Inhale 2 puffs into the lungs 2 (two) times daily.    . carvedilol (COREG) 3.125 MG tablet Take 3.125 mg by mouth daily.    . clobetasol cream (TEMOVATE) 0.05 % Apply 1 application topically 2 (two) times daily.    . cloNIDine (CATAPRES) 0.1 MG tablet     . desonide (DESOWEN) 0.05 % cream     . doxepin (SINEQUAN) 50 MG capsule Take 50 mg by mouth at bedtime.    Marland Kitchen. escitalopram (LEXAPRO) 10 MG tablet Take 10 mg by mouth daily.    Marland Kitchen. esomeprazole (NEXIUM) 40 MG capsule Take 40 mg by mouth daily before breakfast.    . ferrous fumarate (HEMOCYTE - 106 MG FE) 325 (106 FE) MG TABS Take 1 tablet by mouth 2 (two) times daily.    . fluticasone (FLONASE) 50 MCG/ACT nasal spray Place 2 sprays into the nose daily.    . furosemide (  LASIX) 40 MG tablet Take 1 tablet (40 mg total) by mouth daily. 30 tablet 0  . glipiZIDE (GLUCOTROL XL) 5 MG 24 hr tablet Take 1 tablet by mouth daily.    . hydrOXYzine (ATARAX/VISTARIL) 25 MG tablet Take 25 mg by mouth every 4 (four) hours as needed. For itching    . ketoconazole (NIZORAL) 2 % cream     . lisinopril (PRINIVIL,ZESTRIL) 20 MG tablet Take 1 tablet (20 mg total) by mouth daily. 30 tablet 0  . loratadine (CLARITIN) 10 MG tablet Take 10 mg by mouth daily.    . potassium chloride SA (K-DUR,KLOR-CON) 20 MEQ tablet Take 1 tablet (20 mEq total) by mouth 2 (two) times daily. 60 tablet 1  . traMADol (ULTRAM) 50 MG tablet       Results for orders placed or performed during the hospital encounter of 07/06/14 (from the past 48 hour(s))  CBC WITH DIFFERENTIAL     Status: Abnormal (Preliminary result)   Collection Time: 07/06/14 12:19 PM  Result Value Ref Range   WBC 7.7 4.0 - 10.5 K/uL   RBC 3.85 (L) 3.87 - 5.11 MIL/uL    Hemoglobin 8.5 (L) 12.0 - 15.0 g/dL   HCT 40.928.2 (L) 81.136.0 - 91.446.0 %   MCV 73.2 (L) 78.0 - 100.0 fL   MCH 22.1 (L) 26.0 - 34.0 pg   MCHC 30.1 30.0 - 36.0 g/dL   RDW 78.220.3 (H) 95.611.5 - 21.315.5 %   Platelets 294 150 - 400 K/uL   Neutrophils Relative % PENDING 43 - 77 %   Neutro Abs PENDING 1.7 - 7.7 K/uL   Band Neutrophils PENDING 0 - 10 %   Lymphocytes Relative PENDING 12 - 46 %   Lymphs Abs PENDING 0.7 - 4.0 K/uL   Monocytes Relative PENDING 3 - 12 %   Monocytes Absolute PENDING 0.1 - 1.0 K/uL   Eosinophils Relative PENDING 0 - 5 %   Eosinophils Absolute PENDING 0.0 - 0.7 K/uL   Basophils Relative PENDING 0 - 1 %   Basophils Absolute PENDING 0.0 - 0.1 K/uL   WBC Morphology PENDING    RBC Morphology PENDING    Smear Review PENDING    nRBC PENDING 0 /100 WBC   Metamyelocytes Relative PENDING %   Myelocytes PENDING %   Promyelocytes Absolute PENDING %   Blasts PENDING %   No results found.  Review Of Systems + weight gain/loss, Wears glasses, No hearing loss, + chest pain, + dyspnea, + edema, + hepatitis C, negative stroke, kidney stone or psych admission. No hemoptysis or gastrointestinal or genitourinary bleed.  Blood pressure 129/64, pulse 53, temperature 97.5 F (36.4 C), temperature source Oral, resp. rate 17, weight 96.9 kg (213 lb 10 oz), SpO2 100 %. General: Averagely developed and well nourished. Mild respiratory distress.  HEENT: [place]/AT, brown eyes, conj-pale, sclera-non-icteric. Tongue pale, mid-line.  Neck -No JVD.  Lungs: Basal crackles, bilaterally.  Heart: Normal S1 and S2. II/VI systolic murmur.  Abdomen: Soft, distendednon-tender.  Ext-2 + lower ext. Edema with early varicose veins.  CNS: Cranial nerves grossly intact. Moves all 4 ext. Skin:-Warm and dry  Assessment/Plan Bilateral lower extremity edema R/O acute systolic heart failure Hypertension Chronic anemia COPD Gout  IV lasix/Pro-BNP/Echocardiogram  Eyleen Rawlinson S, MD  07/06/2014, 1:01  PM

## 2014-07-07 LAB — CBC
HEMATOCRIT: 29.2 % — AB (ref 36.0–46.0)
Hemoglobin: 8.7 g/dL — ABNORMAL LOW (ref 12.0–15.0)
MCH: 21.9 pg — AB (ref 26.0–34.0)
MCHC: 29.8 g/dL — ABNORMAL LOW (ref 30.0–36.0)
MCV: 73.6 fL — AB (ref 78.0–100.0)
PLATELETS: 305 10*3/uL (ref 150–400)
RBC: 3.97 MIL/uL (ref 3.87–5.11)
RDW: 20.3 % — ABNORMAL HIGH (ref 11.5–15.5)
WBC: 7.5 10*3/uL (ref 4.0–10.5)

## 2014-07-07 LAB — IRON AND TIBC
Iron: 171 ug/dL — ABNORMAL HIGH (ref 42–135)
Saturation Ratios: 38 % (ref 20–55)
TIBC: 453 ug/dL (ref 250–470)
UIBC: 282 ug/dL (ref 125–400)

## 2014-07-07 LAB — BASIC METABOLIC PANEL
Anion gap: 13 (ref 5–15)
BUN: 25 mg/dL — AB (ref 6–23)
CO2: 29 mEq/L (ref 19–32)
Calcium: 9.2 mg/dL (ref 8.4–10.5)
Chloride: 100 mEq/L (ref 96–112)
Creatinine, Ser: 0.91 mg/dL (ref 0.50–1.10)
GFR calc Af Amer: 68 mL/min — ABNORMAL LOW (ref >90)
GFR, EST NON AFRICAN AMERICAN: 59 mL/min — AB (ref >90)
GLUCOSE: 99 mg/dL (ref 70–99)
POTASSIUM: 3.8 meq/L (ref 3.7–5.3)
Sodium: 142 mEq/L (ref 137–147)

## 2014-07-07 LAB — FERRITIN: Ferritin: 85 ng/mL (ref 10–291)

## 2014-07-07 LAB — TROPONIN I: Troponin I: <0.3 ng/mL (ref <0.30)

## 2014-07-07 MED ORDER — HYDROCORTISONE 1 % EX CREA
TOPICAL_CREAM | Freq: Three times a day (TID) | CUTANEOUS | Status: DC
  Administered 2014-07-07: 18:00:00 via TOPICAL
  Administered 2014-07-07: 1 via TOPICAL
  Administered 2014-07-08: 09:00:00 via TOPICAL
  Filled 2014-07-07: qty 28

## 2014-07-07 NOTE — Progress Notes (Signed)
  Echocardiogram 2D Echocardiogram has been performed.  Monique Lewis Monique Lewis 07/07/2014, 11:12 AM

## 2014-07-08 NOTE — Progress Notes (Addendum)
Ref: Osei A Bonsu, DO Lare Entry  Subjective:  Feeling better. Lost about 5 pounds of weight with lasix use.  Objective:  Vital Signs in the last 24 hours: Temp:  [97.8 F (36.6 C)-98.2 F (36.8 C)] 97.8 F (36.6 C) (12/10 1550) Pulse Rate:  [55-66] 55 (12/10 1550) Cardiac Rhythm:  [-] Sinus bradycardia (12/10 0800) Resp:  [18] 18 (12/10 1550) BP: (137-157)/(51-63) 137/51 mmHg (12/10 1550) SpO2:  [97 %-100 %] 98 % (12/10 1550) Weight:  [93.8 kg (206 lb 12.7 oz)] 93.8 kg (206 lb 12.7 oz) (12/10 0354)  Physical Exam: BP Readings from Last 1 Encounters:  07/08/14 137/51    Wt Readings from Last 1 Encounters:  07/08/14 93.8 kg (206 lb 12.7 oz)    Weight change: -3.1 kg (-6 lb 13.4 oz)  HEENT: [place]/AT, Eyes-Brown, PERL, EOMI, Conjunctiva-Pale, Sclera-Non-icteric Neck: No JVD, No bruit, Trachea midline. Lungs:  Clear, Bilateral. Cardiac:  Regular rhythm, normal S1 and S2, no S3.  Abdomen:  Soft, non-tender. Extremities:  1 + edema present. No cyanosis. No clubbing. CNS: AxOx3, Cranial nerves grossly intact, moves all 4 extremities.  Skin: Warm and dry.   Intake/Output from previous day: 12/09 0701 - 12/10 0700 In: 240 [P.O.:240] Out: 650 [Urine:650]    Lab Results: BMET    Component Value Date/Time   NA 142 07/07/2014 0410   NA 140 07/06/2014 1219   NA 141 09/05/2012 0620   K 3.8 07/07/2014 0410   K 3.9 07/06/2014 1219   K 4.8 09/05/2012 0620   CL 100 07/07/2014 0410   CL 99 07/06/2014 1219   CL 104 09/05/2012 0620   CO2 29 07/07/2014 0410   CO2 28 07/06/2014 1219   CO2 32 09/05/2012 0620   GLUCOSE 99 07/07/2014 0410   GLUCOSE 93 07/06/2014 1219   GLUCOSE 123* 09/05/2012 0620   BUN 25* 07/07/2014 0410   BUN 31* 07/06/2014 1219   BUN 21 09/05/2012 0620   CREATININE 0.91 07/07/2014 0410   CREATININE 1.06 07/06/2014 1219   CREATININE 1.11* 09/05/2012 0620   CALCIUM 9.2 07/07/2014 0410   CALCIUM 9.5 07/06/2014 1219   CALCIUM 9.1 09/05/2012 0620   GFRNONAA 59*  07/07/2014 0410   GFRNONAA 49* 07/06/2014 1219   GFRNONAA 47* 09/05/2012 0620   GFRAA 68* 07/07/2014 0410   GFRAA 57* 07/06/2014 1219   GFRAA 54* 09/05/2012 0620   CBC    Component Value Date/Time   WBC 7.5 07/07/2014 0410   RBC 3.97 07/07/2014 0410   HGB 8.7* 07/07/2014 0410   HCT 29.2* 07/07/2014 0410   PLT 305 07/07/2014 0410   MCV 73.6* 07/07/2014 0410   MCH 21.9* 07/07/2014 0410   MCHC 29.8* 07/07/2014 0410   RDW 20.3* 07/07/2014 0410   LYMPHSABS 2.5 07/06/2014 1219   MONOABS 0.9 07/06/2014 1219   EOSABS 0.2 07/06/2014 1219   BASOSABS 0.0 07/06/2014 1219   HEPATIC Function Panel  Recent Labs  07/06/14 1219  PROT 7.7   HEMOGLOBIN A1C No components found for: HGA1C,  MPG CARDIAC ENZYMES Lab Results  Component Value Date   CKTOTAL 204* 06/12/2012   CKMB 5.4* 06/12/2012   TROPONINI <0.30 07/06/2014   TROPONINI <0.30 07/06/2014   TROPONINI <0.30 07/06/2014   BNP  Recent Labs  07/06/14 1219  PROBNP 184.5   TSH  Recent Labs  07/06/14 1219  TSH 4.300   CHOLESTEROL No results for input(s): CHOL in the last 8760 hours.  Scheduled Meds: . budesonide-formoterol  2 puff Inhalation BID  .  carvedilol  3.125 mg Oral Daily  . cloNIDine  0.1 mg Oral BID  . doxepin  50 mg Oral QHS  . escitalopram  10 mg Oral Daily  . fluticasone  2 spray Each Nare Daily  . glipiZIDE  5 mg Oral Daily  . heparin  5,000 Units Subcutaneous 3 times per day  . hydrocortisone cream   Topical TID  . lisinopril  20 mg Oral Daily  . loratadine  10 mg Oral Daily  . pantoprazole  40 mg Oral Daily  . potassium chloride SA  20 mEq Oral BID  . sodium chloride  3 mL Intravenous Q12H  . traMADol  50 mg Oral Q12H   Continuous Infusions:  PRN Meds:.albuterol, hydrOXYzine  Assessment/Plan: Bilateral lower extremity edema Obesity Hypertension Chronic anemia COPD Gout  Continue medical treatment. Check echocardiogram     LOS: 1 days    Orpah CobbAjay Breckan Cafiero  MD  07/08/2014, 4:55  PM

## 2014-07-08 NOTE — Discharge Summary (Signed)
Physician Discharge Summary  Patient ID: Monique Lewis MRN: [id number] DOB/AGE: Aug 08, 1932 80 y.o.  Admit date: 07/06/2014 Discharge date: 07/08/2014  Admission Diagnoses: Bilateral lower extremity edema Obesity Hypertension Chronic anemia COPD Gout  Discharge Diagnoses:  Principle Problem: * Acute diastolic heart failure * Bilateral edema of lower extremity  Obesity Hypertension Chronic anemia without iron deficiency COPD Gout  Discharged Condition: fair  Hospital Course: 80 years old female with 14 pounds of weight gain in 1 month and bilateral leg edema with shortness of breath x 1 week. Her pro-BNP was unremarkable, she had preserved LV systolic function on echocardiogram and chest x-ray showed vascular congestion. She had good diuresis with lasix and was discharged home with instruction on decreasing salt and fluid intake. She will be followed by me in 1 week. She will ask primary care doctor to refer her to GI and may be hematologist for high iron levels with low hemoglobin.  Consults: cardiology  Significant Diagnostic Studies: labs: Hgb-8.5, normal WBC and platelets count. Near normal CMET  and pro-BNP. Normal iron level and TSH level.  Left ventricle: The cavity size was normal. There was mild concentric hypertrophy. Systolic function was vigorous. The estimated ejection fraction was in the range of 65% to 70%. Wall motion was normal; there were no regional wall motion abnormalities. Doppler parameters are consistent with abnormal left ventricular relaxation (grade 1 diastolic dysfunction). - Aortic valve: There was trivial regurgitation. - Mitral valve: There was mild regurgitation. - Left atrium: The atrium was moderately dilated. - Pulmonary arteries: Systolic pressure was mildly increased. PA peak pressure: 35 mm Hg (S).  Treatments: cardiac meds: carvedilol and furosemide.  Discharge Exam: Blood pressure 137/51, pulse 55, temperature 97.8 F (36.6 C),  temperature source Oral, resp. rate 18, height 5' (1.524 m), weight 93.8 kg (206 lb 12.7 oz), SpO2 98 %. HEENT: [place]/AT, Eyes-Brown, PERL, EOMI, Conjunctiva-Pale, Sclera-Non-icteric Neck: No JVD, No bruit, Trachea midline. Lungs: Clear, Bilateral. Cardiac: Regular rhythm, normal S1 and S2, no S3.  Abdomen: Soft, non-tender. Extremities: 1 + edema present. No cyanosis. No clubbing. CNS: AxOx3, Cranial nerves grossly intact, moves all 4 extremities.  Skin: Warm and dry.  Disposition: 01-Home or Self Care     Medication List    STOP taking these medications        AZOR 10-40 MG per tablet  Generic drug:  amLODipine-olmesartan     ferrous fumarate 325 (106 FE) MG Tabs tablet  Commonly known as:  HEMOCYTE - 106 mg FE      TAKE these medications        albuterol 108 (90 BASE) MCG/ACT inhaler  Commonly known as:  PROVENTIL HFA;VENTOLIN HFA  Inhale 2 puffs into the lungs every 4 (four) hours as needed. For wheezing     budesonide-formoterol 160-4.5 MCG/ACT inhaler  Commonly known as:  SYMBICORT  Inhale 2 puffs into the lungs 2 (two) times daily.     carvedilol 3.125 MG tablet  Commonly known as:  COREG  Take 3.125 mg by mouth daily.     clobetasol cream 0.05 %  Commonly known as:  TEMOVATE  Apply 1 application topically 2 (two) times daily.     cloNIDine 0.1 MG tablet  Commonly known as:  CATAPRES     desonide 0.05 % cream  Commonly known as:  DESOWEN     doxepin 50 MG capsule  Commonly known as:  SINEQUAN  Take 50 mg by mouth at bedtime.     escitalopram 10 MG tablet  Commonly known as:  LEXAPRO  Take 10 mg by mouth daily.     esomeprazole 40 MG capsule  Commonly known as:  NEXIUM  Take 40 mg by mouth daily before breakfast.     fluticasone 50 MCG/ACT nasal spray  Commonly known as:  FLONASE  Place 2 sprays into the nose daily.     furosemide 40 MG tablet  Commonly known as:  LASIX  Take 1 tablet (40 mg total) by mouth daily.     glipiZIDE 5 MG 24 hr  tablet  Commonly known as:  GLUCOTROL XL  Take 1 tablet by mouth daily.     hydrOXYzine 25 MG tablet  Commonly known as:  ATARAX/VISTARIL  Take 25 mg by mouth every 4 (four) hours as needed. For itching     ketoconazole 2 % cream  Commonly known as:  NIZORAL     lisinopril 20 MG tablet  Commonly known as:  PRINIVIL,ZESTRIL  Take 1 tablet (20 mg total) by mouth daily.     loratadine 10 MG tablet  Commonly known as:  CLARITIN  Take 10 mg by mouth daily.     potassium chloride SA 20 MEQ tablet  Commonly known as:  K-DUR,KLOR-CON  Take 1 tablet (20 mEq total) by mouth 2 (two) times daily.     traMADol 50 MG tablet  Commonly known as:  ULTRAM           Follow-up Information    Follow up with Osei A Bonsu, DO In 1 month.   Specialty:  Internal Medicine   Why:  follow up   Contact information:   [facility] [place] [id number] [phone]       [facility]AD[facility],Belicia Difatta S, MD. Schedule an appointment as soon as possible for a visit in 1 week.   Specialty:  Cardiology   Why:  follow up   Contact information:   [address] [place] [address] [phone]       Signed: Ricki RodriguezKADAKIA,Rance Smithson S 07/08/2014, 4:59 PM

## 2014-07-08 NOTE — Progress Notes (Signed)
Reviewed discharge instructions & F/U appointments with patient. Patient verbalizes understanding of following up with HCP. Educated patient on diet and fluid restriction.  VSS at discharge. Patient verbalizes understanding and accompanied home by daughter.

## 2014-08-19 ENCOUNTER — Inpatient Hospital Stay (HOSPITAL_COMMUNITY): Payer: Medicare Other

## 2014-08-19 ENCOUNTER — Inpatient Hospital Stay (HOSPITAL_COMMUNITY)
Admission: EM | Admit: 2014-08-19 | Discharge: 2014-08-23 | DRG: 293 | Disposition: A | Discharge status: Home / Self Care | Payer: Medicare Other | Attending: Internal Medicine | Admitting: Internal Medicine

## 2014-08-19 ENCOUNTER — Emergency Department (HOSPITAL_COMMUNITY): Payer: Medicare Other

## 2014-08-19 ENCOUNTER — Encounter (HOSPITAL_COMMUNITY): Payer: Self-pay | Admitting: Emergency Medicine

## 2014-08-19 DIAGNOSIS — R0602 Shortness of breath: Secondary | ICD-10-CM

## 2014-08-19 DIAGNOSIS — R600 Localized edema: Secondary | ICD-10-CM

## 2014-08-19 DIAGNOSIS — R001 Bradycardia, unspecified: Secondary | ICD-10-CM | POA: Diagnosis present

## 2014-08-19 DIAGNOSIS — I10 Essential (primary) hypertension: Secondary | ICD-10-CM | POA: Diagnosis present

## 2014-08-19 DIAGNOSIS — I5033 Acute on chronic diastolic (congestive) heart failure: Principal | ICD-10-CM | POA: Diagnosis present

## 2014-08-19 DIAGNOSIS — E669 Obesity, unspecified: Secondary | ICD-10-CM | POA: Diagnosis present

## 2014-08-19 DIAGNOSIS — M1990 Unspecified osteoarthritis, unspecified site: Secondary | ICD-10-CM | POA: Diagnosis present

## 2014-08-19 DIAGNOSIS — I272 Other secondary pulmonary hypertension: Secondary | ICD-10-CM | POA: Diagnosis present

## 2014-08-19 DIAGNOSIS — K219 Gastro-esophageal reflux disease without esophagitis: Secondary | ICD-10-CM | POA: Diagnosis present

## 2014-08-19 DIAGNOSIS — R609 Edema, unspecified: Secondary | ICD-10-CM | POA: Diagnosis not present

## 2014-08-19 DIAGNOSIS — J449 Chronic obstructive pulmonary disease, unspecified: Secondary | ICD-10-CM | POA: Diagnosis present

## 2014-08-19 DIAGNOSIS — R110 Nausea: Secondary | ICD-10-CM | POA: Diagnosis present

## 2014-08-19 DIAGNOSIS — B1920 Unspecified viral hepatitis C without hepatic coma: Secondary | ICD-10-CM | POA: Diagnosis present

## 2014-08-19 DIAGNOSIS — Z96653 Presence of artificial knee joint, bilateral: Secondary | ICD-10-CM | POA: Diagnosis present

## 2014-08-19 DIAGNOSIS — F32A Depression, unspecified: Secondary | ICD-10-CM

## 2014-08-19 DIAGNOSIS — J45909 Unspecified asthma, uncomplicated: Secondary | ICD-10-CM | POA: Diagnosis present

## 2014-08-19 DIAGNOSIS — N390 Urinary tract infection, site not specified: Secondary | ICD-10-CM

## 2014-08-19 DIAGNOSIS — R197 Diarrhea, unspecified: Secondary | ICD-10-CM | POA: Diagnosis present

## 2014-08-19 DIAGNOSIS — Z6839 Body mass index (BMI) 39.0-39.9, adult: Secondary | ICD-10-CM | POA: Diagnosis not present

## 2014-08-19 DIAGNOSIS — D649 Anemia, unspecified: Secondary | ICD-10-CM | POA: Diagnosis present

## 2014-08-19 DIAGNOSIS — E119 Type 2 diabetes mellitus without complications: Secondary | ICD-10-CM | POA: Diagnosis present

## 2014-08-19 DIAGNOSIS — N179 Acute kidney failure, unspecified: Secondary | ICD-10-CM

## 2014-08-19 DIAGNOSIS — F329 Major depressive disorder, single episode, unspecified: Secondary | ICD-10-CM

## 2014-08-19 LAB — TROPONIN I

## 2014-08-19 LAB — URINALYSIS, ROUTINE W REFLEX MICROSCOPIC
Bilirubin Urine: NEGATIVE
GLUCOSE, UA: NEGATIVE mg/dL
HGB URINE DIPSTICK: NEGATIVE
KETONES UR: NEGATIVE mg/dL
LEUKOCYTES UA: NEGATIVE
Nitrite: NEGATIVE
Protein, ur: NEGATIVE mg/dL
SPECIFIC GRAVITY, URINE: 1.012 (ref 1.005–1.030)
UROBILINOGEN UA: 0.2 mg/dL (ref 0.0–1.0)
pH: 6 (ref 5.0–8.0)

## 2014-08-19 LAB — CBC
HCT: 32.8 % — ABNORMAL LOW (ref 36.0–46.0)
Hemoglobin: 10 g/dL — ABNORMAL LOW (ref 12.0–15.0)
MCH: 23.4 pg — ABNORMAL LOW (ref 26.0–34.0)
MCHC: 30.5 g/dL (ref 30.0–36.0)
MCV: 76.6 fL — ABNORMAL LOW (ref 78.0–100.0)
PLATELETS: 262 10*3/uL (ref 150–400)
RBC: 4.28 MIL/uL (ref 3.87–5.11)
RDW: 20.7 % — AB (ref 11.5–15.5)
WBC: 6.2 10*3/uL (ref 4.0–10.5)

## 2014-08-19 LAB — BASIC METABOLIC PANEL
Anion gap: 10 (ref 5–15)
BUN: 26 mg/dL — ABNORMAL HIGH (ref 6–23)
CALCIUM: 9 mg/dL (ref 8.4–10.5)
CO2: 27 mmol/L (ref 19–32)
Chloride: 100 mEq/L (ref 96–112)
Creatinine, Ser: 1.33 mg/dL — ABNORMAL HIGH (ref 0.50–1.10)
GFR calc Af Amer: 43 mL/min — ABNORMAL LOW (ref >90)
GFR calc non Af Amer: 37 mL/min — ABNORMAL LOW (ref >90)
Glucose, Bld: 150 mg/dL — ABNORMAL HIGH (ref 70–99)
POTASSIUM: 4.8 mmol/L (ref 3.5–5.1)
Sodium: 137 mmol/L (ref 135–145)

## 2014-08-19 LAB — BRAIN NATRIURETIC PEPTIDE: B Natriuretic Peptide: 56.5 pg/mL (ref 0.0–100.0)

## 2014-08-19 LAB — GLUCOSE, CAPILLARY: GLUCOSE-CAPILLARY: 81 mg/dL (ref 70–99)

## 2014-08-19 MED ORDER — TRAMADOL HCL 50 MG PO TABS
50.0000 mg | ORAL_TABLET | Freq: Four times a day (QID) | ORAL | Status: DC | PRN
Start: 2014-08-19 — End: 2014-08-23
  Administered 2014-08-20: 50 mg via ORAL
  Filled 2014-08-19: qty 1

## 2014-08-19 MED ORDER — SODIUM CHLORIDE 0.9 % IJ SOLN
3.0000 mL | Freq: Two times a day (BID) | INTRAMUSCULAR | Status: DC
  Administered 2014-08-19 – 2014-08-23 (×8): 3 mL via INTRAVENOUS

## 2014-08-19 MED ORDER — PANTOPRAZOLE SODIUM 40 MG PO TBEC: 40.0000 mg | DELAYED_RELEASE_TABLET | Freq: Every day | ORAL | Status: DC

## 2014-08-19 MED ORDER — FUROSEMIDE 40 MG PO TABS
40.0000 mg | ORAL_TABLET | Freq: Every day | ORAL | Status: DC
  Administered 2014-08-20 – 2014-08-21 (×2): 40 mg via ORAL
  Filled 2014-08-19 (×2): qty 1

## 2014-08-19 MED ORDER — BUDESONIDE-FORMOTEROL FUMARATE 160-4.5 MCG/ACT IN AERO
2.0000 | INHALATION_SPRAY | Freq: Two times a day (BID) | RESPIRATORY_TRACT | Status: DC
  Administered 2014-08-19 – 2014-08-23 (×6): 2 via RESPIRATORY_TRACT
  Filled 2014-08-19 (×2): qty 6

## 2014-08-19 MED ORDER — CLONIDINE HCL 0.1 MG PO TABS: 0.1000 mg | ORAL_TABLET | Freq: Every day | ORAL | Status: DC

## 2014-08-19 MED ORDER — ACETAMINOPHEN 325 MG PO TABS
650.0000 mg | ORAL_TABLET | ORAL | Status: DC | PRN
  Administered 2014-08-20 – 2014-08-23 (×6): 650 mg via ORAL
  Filled 2014-08-19 (×6): qty 2

## 2014-08-19 MED ORDER — HYDROXYZINE HCL 25 MG PO TABS
25.0000 mg | ORAL_TABLET | ORAL | Status: DC | PRN
  Filled 2014-08-19: qty 1

## 2014-08-19 MED ORDER — CLOBETASOL PROPIONATE 0.05 % EX CREA
1.0000 "application " | TOPICAL_CREAM | Freq: Two times a day (BID) | CUTANEOUS | Status: DC
  Administered 2014-08-19 – 2014-08-23 (×8): 1 via TOPICAL
  Filled 2014-08-19 (×3): qty 15

## 2014-08-19 MED ORDER — CARVEDILOL 3.125 MG PO TABS
3.1250 mg | ORAL_TABLET | Freq: Every day | ORAL | Status: DC
  Administered 2014-08-20 – 2014-08-21 (×2): 3.125 mg via ORAL
  Filled 2014-08-19 (×2): qty 1

## 2014-08-19 MED ORDER — ALBUTEROL SULFATE (2.5 MG/3ML) 0.083% IN NEBU: 2.5000 mg | INHALATION_SOLUTION | RESPIRATORY_TRACT | Status: DC | PRN

## 2014-08-19 MED ORDER — ALBUTEROL SULFATE HFA 108 (90 BASE) MCG/ACT IN AERS: 2.0000 | INHALATION_SPRAY | RESPIRATORY_TRACT | Status: DC | PRN

## 2014-08-19 MED ORDER — ESCITALOPRAM OXALATE 10 MG PO TABS: 10.0000 mg | ORAL_TABLET | Freq: Every day | ORAL | Status: DC

## 2014-08-19 MED ORDER — PANTOPRAZOLE SODIUM 40 MG PO TBEC
40.0000 mg | DELAYED_RELEASE_TABLET | Freq: Every day | ORAL | Status: DC
  Administered 2014-08-20 – 2014-08-23 (×4): 40 mg via ORAL
  Filled 2014-08-19 (×4): qty 1

## 2014-08-19 MED ORDER — GLIPIZIDE ER 5 MG PO TB24
5.0000 mg | ORAL_TABLET | Freq: Every day | ORAL | Status: DC
  Filled 2014-08-19: qty 1

## 2014-08-19 MED ORDER — FLUTICASONE PROPIONATE 50 MCG/ACT NA SUSP
2.0000 | Freq: Every day | NASAL | Status: DC
  Administered 2014-08-20 – 2014-08-23 (×4): 2 via NASAL
  Filled 2014-08-19: qty 16

## 2014-08-19 MED ORDER — FUROSEMIDE 10 MG/ML IJ SOLN
40.0000 mg | Freq: Once | INTRAMUSCULAR | Status: AC
  Administered 2014-08-19: 40 mg via INTRAVENOUS
  Filled 2014-08-19: qty 4

## 2014-08-19 MED ORDER — SODIUM CHLORIDE 0.9 % IV SOLN: 250.0000 mL | INTRAVENOUS | Status: DC | PRN

## 2014-08-19 MED ORDER — POTASSIUM CHLORIDE CRYS ER 20 MEQ PO TBCR
20.0000 meq | EXTENDED_RELEASE_TABLET | Freq: Two times a day (BID) | ORAL | Status: DC
  Administered 2014-08-19 – 2014-08-23 (×8): 20 meq via ORAL
  Filled 2014-08-19 (×11): qty 1

## 2014-08-19 MED ORDER — ESCITALOPRAM OXALATE 10 MG PO TABS
10.0000 mg | ORAL_TABLET | Freq: Every day | ORAL | Status: DC
  Administered 2014-08-20 – 2014-08-23 (×4): 10 mg via ORAL
  Filled 2014-08-19 (×4): qty 1

## 2014-08-19 MED ORDER — SODIUM CHLORIDE 0.9 % IJ SOLN: 3.0000 mL | INTRAMUSCULAR | Status: DC | PRN

## 2014-08-19 MED ORDER — CARVEDILOL 3.125 MG PO TABS: 3.1250 mg | ORAL_TABLET | Freq: Every day | ORAL | Status: DC

## 2014-08-19 MED ORDER — HEPARIN SODIUM (PORCINE) 5000 UNIT/ML IJ SOLN
5000.0000 [IU] | Freq: Three times a day (TID) | INTRAMUSCULAR | Status: DC
  Administered 2014-08-19 – 2014-08-23 (×11): 5000 [IU] via SUBCUTANEOUS
  Filled 2014-08-19 (×15): qty 1

## 2014-08-19 MED ORDER — LORATADINE 10 MG PO TABS
10.0000 mg | ORAL_TABLET | Freq: Every day | ORAL | Status: DC
  Administered 2014-08-20 – 2014-08-23 (×4): 10 mg via ORAL
  Filled 2014-08-19 (×4): qty 1

## 2014-08-19 MED ORDER — ONDANSETRON HCL 4 MG/2ML IJ SOLN
4.0000 mg | Freq: Four times a day (QID) | INTRAMUSCULAR | Status: DC | PRN
  Administered 2014-08-22: 4 mg via INTRAVENOUS
  Filled 2014-08-19: qty 2

## 2014-08-19 MED ORDER — DOXEPIN HCL 50 MG PO CAPS
50.0000 mg | ORAL_CAPSULE | Freq: Every day | ORAL | Status: DC
  Administered 2014-08-19 – 2014-08-22 (×4): 50 mg via ORAL
  Filled 2014-08-19 (×5): qty 1

## 2014-08-19 MED ORDER — CLONIDINE HCL 0.1 MG PO TABS
0.1000 mg | ORAL_TABLET | Freq: Every day | ORAL | Status: DC
  Administered 2014-08-20 – 2014-08-23 (×4): 0.1 mg via ORAL
  Filled 2014-08-19 (×4): qty 1

## 2014-08-19 MED ORDER — KETOCONAZOLE 2 % EX CREA
TOPICAL_CREAM | Freq: Two times a day (BID) | CUTANEOUS | Status: DC | PRN
  Filled 2014-08-19: qty 15

## 2014-08-19 NOTE — ED Notes (Signed)
Performed in and out cath to collect urine specimen with assistance from [place], Charity fundraiserN.

## 2014-08-19 NOTE — Progress Notes (Signed)
Pt arrived to floor in NAD. VSS. Telemetry monitor placed on pt. Pt educated on how to use call bell and telephone to reach RN and NT. Pt oriented to room and instructed to call for assistance to ambulate. Pt A/Ox4. Will continue to monitor. Huel Coventryosenberger, Karla Pavone A, RN

## 2014-08-19 NOTE — ED Provider Notes (Signed)
CSN: [id number]     Arrival date & time 08/19/14  1634 History   First MD Initiated Contact with Patient 08/19/14 1745     Chief Complaint  Patient presents with  . Urinary Retention  . Shortness of Breath     (Consider location/radiation/quality/duration/timing/severity/associated sxs/prior Treatment) HPI Comments: Patient complains of decreased urination 2 days as well as shortness of breath with bilateral lower extremity edema. She also notes dyspnea on exertion as well as increased orthopnea. Denies any anginal type chest pain chest pressure. Complains of chronic lower back pain. Denies any dysuria or hematuria. No fever or chills. Dyspnea is worse with exertion better with rest. Does have a history of CHF and has been compliant with her diuretics. Uses a walker normally to ambulate. Increase cough nonproductive. Denies any syncope or near-syncope.  Patient is a 81 y.o. female presenting with shortness of breath. The history is provided by the patient.  Shortness of Breath   Past Medical History  Diagnosis Date  . Hypertension   . COPD (chronic obstructive pulmonary disease)   . HTN (hypertension) 06/10/2012  . GERD (gastroesophageal reflux disease) 06/10/2012  . Depression 06/10/2012  . Hepatitis-C 06/10/2012    Hx of Hep C after knee surgery 2001 s/p treatment per patient.  . Gout 06/10/2012  . Asthma 06/10/2012  . CHF (congestive heart failure)   . Heart murmur   . Anginal pain   . Pneumonia ~ 2001  . Exertional dyspnea   . Iron deficiency anemia 06/10/2012  . History of blood transfusion     "several; first one I caught Hepatitis" (06/10/12)  . Headache(784.0)     "severe last 3 wk" (06/10/12)  . Arthritis     "all over my body" (06/10/12)  . Chronic lower back pain   . Recurrent UTI (urinary tract infection)     "get them maybe q 6 months" (06/10/12)  . Allergy   . Hyperpotassemia   . Family history of adverse reaction to anesthesia     " my sister had  complications but I dont know what "   Past Surgical History  Procedure Laterality Date  . Cholecystectomy  2006  . Tonsillectomy and adenoidectomy  1940's  . Dilation and curettage of uterus    . Joint replacement      s/p 5 knee replacements  . Total knee arthroplasty  2001 - 2011    "5 total; have had them done on both knees" (06/10/12)   No family history on file. History  Substance Use Topics  . Smoking status: Never Smoker   . Smokeless tobacco: Never Used  . Alcohol Use: No   OB History    No data available     Review of Systems  Respiratory: Positive for shortness of breath.   All other systems reviewed and are negative.     Allergies  Celebrex; Sulfa antibiotics; Codeine; Shellfish allergy; Iodine; and Latex  Home Medications   Prior to Admission medications   Medication Sig Start Date End Date Taking? Authorizing Provider  albuterol (PROVENTIL HFA;VENTOLIN HFA) 108 (90 BASE) MCG/ACT inhaler Inhale 2 puffs into the lungs every 4 (four) hours as needed. For wheezing    Historical Provider, MD  budesonide-formoterol (SYMBICORT) 160-4.5 MCG/ACT inhaler Inhale 2 puffs into the lungs 2 (two) times daily.    Historical Provider, MD  carvedilol (COREG) 3.125 MG tablet Take 3.125 mg by mouth daily.    Historical Provider, MD  clobetasol cream (TEMOVATE) 0.05 % Apply 1 application  topically 2 (two) times daily.    Historical Provider, MD  cloNIDine (CATAPRES) 0.1 MG tablet  12/01/12   Historical Provider, MD  desonide (DESOWEN) 0.05 % cream  10/23/12   Historical Provider, MD  doxepin (SINEQUAN) 50 MG capsule Take 50 mg by mouth at bedtime.    Historical Provider, MD  escitalopram (LEXAPRO) 10 MG tablet Take 10 mg by mouth daily.    Historical Provider, MD  esomeprazole (NEXIUM) 40 MG capsule Take 40 mg by mouth daily before breakfast.    Historical Provider, MD  fluticasone (FLONASE) 50 MCG/ACT nasal spray Place 2 sprays into the nose daily.    Historical Provider, MD   furosemide (LASIX) 40 MG tablet Take 1 tablet (40 mg total) by mouth daily. 06/12/12   Rhetta Mura, MD  glipiZIDE (GLUCOTROL XL) 5 MG 24 hr tablet Take 1 tablet by mouth daily. 07/01/14   Historical Provider, MD  hydrOXYzine (ATARAX/VISTARIL) 25 MG tablet Take 25 mg by mouth every 4 (four) hours as needed. For itching    Historical Provider, MD  ketoconazole (NIZORAL) 2 % cream  09/12/12   Historical Provider, MD  lisinopril (PRINIVIL,ZESTRIL) 20 MG tablet Take 1 tablet (20 mg total) by mouth daily. 07/11/12   Charles B. Bernette Mayers, MD  loratadine (CLARITIN) 10 MG tablet Take 10 mg by mouth daily.    Historical Provider, MD  potassium chloride SA (K-DUR,KLOR-CON) 20 MEQ tablet Take 1 tablet (20 mEq total) by mouth 2 (two) times daily. 09/05/12   Ricki Rodriguez, MD  traMADol (ULTRAM) 50 MG tablet  09/19/12   Historical Provider, MD   BP 122/54 mmHg  Pulse 57  Temp(Src) 97.9 F (36.6 C) (Oral)  Resp 18  Ht 5' (1.524 m)  Wt 202 lb (91.627 kg)  BMI 39.45 kg/m2  SpO2 97% Physical Exam  Constitutional: She is oriented to person, place, and time. She appears well-developed and well-nourished.  Non-toxic appearance. No distress.  HENT:  Head: Normocephalic and atraumatic.  Eyes: Conjunctivae, EOM and lids are normal. Pupils are equal, round, and reactive to light.  Neck: Normal range of motion. Neck supple. No tracheal deviation present. No thyroid mass present.  Cardiovascular: Normal rate, regular rhythm and normal heart sounds.  Exam reveals no gallop.   No murmur heard. Pulmonary/Chest: Effort normal and breath sounds normal. No stridor. No respiratory distress. She has no decreased breath sounds. She has no wheezes. She has no rhonchi. She has no rales.  Abdominal: Soft. Normal appearance and bowel sounds are normal. She exhibits no distension. There is no tenderness. There is no rebound and no CVA tenderness.  Musculoskeletal: Normal range of motion. She exhibits no edema or tenderness.   3+ bilateral pitting lower extremity edema.  Neurological: She is alert and oriented to person, place, and time. She has normal strength. No cranial nerve deficit or sensory deficit. GCS eye subscore is 4. GCS verbal subscore is 5. GCS motor subscore is 6.  Skin: Skin is warm and dry. No abrasion and no rash noted.  Psychiatric: She has a normal mood and affect. Her speech is normal and behavior is normal.  Nursing note and vitals reviewed.   ED Course  Procedures (including critical care time) Labs Review Labs Reviewed  CBC - Abnormal; Notable for the following:    Hemoglobin 10.0 (*)    HCT 32.8 (*)    MCV 76.6 (*)    MCH 23.4 (*)    RDW 20.7 (*)    All other components  within normal limits  BASIC METABOLIC PANEL - Abnormal; Notable for the following:    Glucose, Bld 150 (*)    BUN 26 (*)    Creatinine, Ser 1.33 (*)    GFR calc non Af Amer 37 (*)    GFR calc Af Amer 43 (*)    All other components within normal limits  TROPONIN I  BRAIN NATRIURETIC PEPTIDE    Imaging Review Dg Chest 2 View  08/19/2014   CLINICAL DATA:  BILATERAL feet and leg swelling, shortness of breath, mid chest pain and dizziness since last week, history hypertension, diabetes, asthma, COPD, CHF  EXAM: CHEST  2 VIEW  COMPARISON:  07/06/2014  FINDINGS: Enlargement of cardiac silhouette with slight vascular congestion.  Large hiatal hernia.  Mediastinal contours otherwise stable.  After sclerotic calcification aorta.  No acute edema or segmental consolidation.  Emphysematous changes with minimal basilar atelectasis.  Cannot exclude small RIGHT subpulmonic pleural effusion.  No pneumothorax.  Levoconvex thoracolumbar scoliosis.  IMPRESSION: Enlargement of cardiac silhouette with pulmonary vascular congestion.  Emphysematous changes with minimal bibasilar atelectasis and questionable small RIGHT pleural effusion.  No acute infiltrate.   Electronically Signed   By: Ulyses SouthwardMark  Boles M.D.   On: 08/19/2014 17:15     EKG  Interpretation   Date/Time:  Thursday August 19 2014 16:40:22 EST Ventricular Rate:  56 PR Interval:  160 QRS Duration: 86 QT Interval:  436 QTC Calculation: 420 R Axis:   101 Text Interpretation:  Sinus bradycardia Rightward axis Borderline ECG No  significant change since last tracing Confirmed by Jalexia Lalli  MD, Newel Oien  ([id number]) on 08/19/2014 6:10:51 PM      MDM   Final diagnoses:  SOB (shortness of breath)    Patient will be admitted for management of her lower extremity edema.    Toy BakerAnthony T Jerris Keltz, MD 08/19/14 1919

## 2014-08-19 NOTE — H&P (Signed)
[facility] History and Physical  Monique Lewis:[id number] DOB: April 24, 1933 DOA: 08/19/2014  Referring physician: EDP PCP: Temple Pacini, DO   Chief Complaint: BLE edema   HPI: DIETRICH Lewis is a 81 y.o. female with history of diastolic CHF.  Patient presents with worsening BLE edema, DOE, and orthopnea over the past week or so.  This has been persistent.  Patient admits that she drank "way to much fluid" last week and she associates her increased PO intake with increased swelling.  She has also noted decreased UOP over the past couple of days as well.  Review of Systems: Systems reviewed.  As above, otherwise negative  Past Medical History  Diagnosis Date  . Hypertension   . COPD (chronic obstructive pulmonary disease)   . HTN (hypertension) 06/10/2012  . GERD (gastroesophageal reflux disease) 06/10/2012  . Depression 06/10/2012  . Hepatitis-C 06/10/2012    Hx of Hep C after knee surgery 2001 s/p treatment per patient.  . Gout 06/10/2012  . Asthma 06/10/2012  . CHF (congestive heart failure)   . Heart murmur   . Anginal pain   . Pneumonia ~ 2001  . Exertional dyspnea   . Iron deficiency anemia 06/10/2012  . History of blood transfusion     "several; first one I caught Hepatitis" (06/10/12)  . Headache(784.0)     "severe last 3 wk" (06/10/12)  . Arthritis     "all over my body" (06/10/12)  . Chronic lower back pain   . Recurrent UTI (urinary tract infection)     "get them maybe q 6 months" (06/10/12)  . Allergy   . Hyperpotassemia   . Family history of adverse reaction to anesthesia     " my sister had complications but I dont know what "   Past Surgical History  Procedure Laterality Date  . Cholecystectomy  2006  . Tonsillectomy and adenoidectomy  1940's  . Dilation and curettage of uterus    . Joint replacement      s/p 5 knee replacements  . Total knee arthroplasty  2001 - 2011    "5 total; have had them done on both knees" (06/10/12)   Social  History:  reports that she has never smoked. She has never used smokeless tobacco. She reports that she does not drink alcohol or use illicit drugs.  Allergies  Allergen Reactions  . Celebrex [Celecoxib] Itching  . Sulfa Antibiotics Itching  . Codeine Itching  . Shellfish Allergy Itching  . Iodine Rash  . Latex Rash    No family history on file.   Prior to Admission medications   Medication Sig Start Date End Date Taking? Authorizing Provider  albuterol (PROVENTIL HFA;VENTOLIN HFA) 108 (90 BASE) MCG/ACT inhaler Inhale 2 puffs into the lungs every 4 (four) hours as needed. For wheezing   Yes Historical Provider, MD  budesonide-formoterol (SYMBICORT) 160-4.5 MCG/ACT inhaler Inhale 2 puffs into the lungs 2 (two) times daily.   Yes Historical Provider, MD  carvedilol (COREG) 3.125 MG tablet Take 3.125 mg by mouth daily.   Yes Historical Provider, MD  clobetasol cream (TEMOVATE) 0.05 % Apply 1 application topically 2 (two) times daily.   Yes Historical Provider, MD  cloNIDine (CATAPRES) 0.1 MG tablet  12/01/12  Yes Historical Provider, MD  desonide (DESOWEN) 0.05 % cream  10/23/12  Yes Historical Provider, MD  doxepin (SINEQUAN) 50 MG capsule Take 50 mg by mouth at bedtime.   Yes Historical Provider, MD  escitalopram (LEXAPRO) 10 MG tablet  Take 10 mg by mouth daily.   Yes Historical Provider, MD  esomeprazole (NEXIUM) 40 MG capsule Take 40 mg by mouth daily before breakfast.   Yes Historical Provider, MD  fluticasone (FLONASE) 50 MCG/ACT nasal spray Place 2 sprays into the nose daily.   Yes Historical Provider, MD  furosemide (LASIX) 40 MG tablet Take 1 tablet (40 mg total) by mouth daily. 06/12/12  Yes Rhetta MuraJai-Gurmukh Samtani, MD  glipiZIDE (GLUCOTROL XL) 5 MG 24 hr tablet Take 1 tablet by mouth daily. 07/01/14  Yes Historical Provider, MD  hydrOXYzine (ATARAX/VISTARIL) 25 MG tablet Take 25 mg by mouth every 4 (four) hours as needed. For itching   Yes Historical Provider, MD  ketoconazole (NIZORAL)  2 % cream  09/12/12  Yes Historical Provider, MD  loratadine (CLARITIN) 10 MG tablet Take 10 mg by mouth daily.   Yes Historical Provider, MD  potassium chloride SA (K-DUR,KLOR-CON) 20 MEQ tablet Take 1 tablet (20 mEq total) by mouth 2 (two) times daily. 09/05/12  Yes Ricki RodriguezAjay S Kadakia, MD  traMADol Janean Sark(ULTRAM) 50 MG tablet  09/19/12  Yes Historical Provider, MD   Physical Exam: Filed Vitals:   08/19/14 1945  BP: 160/66  Pulse: 54  Temp:   Resp: 15    BP 160/66 mmHg  Pulse 54  Temp(Src) 97.9 F (36.6 C) (Oral)  Resp 15  Ht 5' (1.524 m)  Wt 91.627 kg (202 lb)  BMI 39.45 kg/m2  SpO2 99%  General Appearance:    Alert, oriented, no distress, appears stated age  Head:    Normocephalic, atraumatic  Eyes:    PERRL, EOMI, sclera non-icteric        Nose:   Nares without drainage or epistaxis. Mucosa, turbinates normal  Throat:   Moist mucous membranes. Oropharynx without erythema or exudate.  Neck:   Supple. No carotid bruits.  No thyromegaly.  No lymphadenopathy.   Back:     No CVA tenderness, no spinal tenderness  Lungs:     Clear to auscultation bilaterally, without wheezes, rhonchi or rales  Chest wall:    No tenderness to palpitation  Heart:    Regular rate and rhythm without murmurs, gallops, rubs  Abdomen:     Soft, non-tender, nondistended, normal bowel sounds, no organomegaly  Genitalia:    deferred  Rectal:    deferred  Extremities:   4+ pitting edema BLE  Pulses:   2+ and symmetric all extremities  Skin:   Skin color, texture, turgor normal, no rashes or lesions  Lymph nodes:   Cervical, supraclavicular, and axillary nodes normal  Neurologic:   CNII-XII intact. Normal strength, sensation and reflexes      throughout    Labs on Admission:  Basic Metabolic Panel:  Recent Labs Lab 08/19/14 1652  NA 137  K 4.8  CL 100  CO2 27  GLUCOSE 150*  BUN 26*  CREATININE 1.33*  CALCIUM 9.0   Liver Function Tests: No results for input(s): AST, ALT, ALKPHOS, BILITOT, PROT,  ALBUMIN in the last 168 hours. No results for input(s): LIPASE, AMYLASE in the last 168 hours. No results for input(s): AMMONIA in the last 168 hours. CBC:  Recent Labs Lab 08/19/14 1652  WBC 6.2  HGB 10.0*  HCT 32.8*  MCV 76.6*  PLT 262   Cardiac Enzymes:  Recent Labs Lab 08/19/14 1652  TROPONINI <0.03    BNP (last 3 results)  Recent Labs  07/06/14 1219  PROBNP 184.5   CBG: No results for input(s): GLUCAP in  the last 168 hours.  Radiological Exams on Admission: Dg Chest 2 View  08/19/2014   CLINICAL DATA:  BILATERAL feet and leg swelling, shortness of breath, mid chest pain and dizziness since last week, history hypertension, diabetes, asthma, COPD, CHF  EXAM: CHEST  2 VIEW  COMPARISON:  07/06/2014  FINDINGS: Enlargement of cardiac silhouette with slight vascular congestion.  Large hiatal hernia.  Mediastinal contours otherwise stable.  After sclerotic calcification aorta.  No acute edema or segmental consolidation.  Emphysematous changes with minimal basilar atelectasis.  Cannot exclude small RIGHT subpulmonic pleural effusion.  No pneumothorax.  Levoconvex thoracolumbar scoliosis.  IMPRESSION: Enlargement of cardiac silhouette with pulmonary vascular congestion.  Emphysematous changes with minimal bibasilar atelectasis and questionable small RIGHT pleural effusion.  No acute infiltrate.   Electronically Signed   By: Ulyses Southward M.D.   On: 08/19/2014 17:15    EKG: Independently reviewed.  Assessment/Plan Principal Problem:   Acute on chronic diastolic congestive heart failure Active Problems:   HTN (hypertension)   Lower extremity edema   DM2 (diabetes mellitus, type 2)   1. Acute on chronic diastolic CHF - due to excessive PO fluid intake 1. Renal US pending given slight increase in creatinine from last month 2. Repeat BMP daily 3. Lasix  IV given in ED, have ordered to continue her  PO daily for now, may require another IV dose tomorrow 4. Strict intake  and output 2. DM2 - continue home meds, CBG checks AC/HS 3. HTN - continue home meds    Code Status: Full Code  Family Communication: No family in room Disposition Plan: Admit to inpatient   Time spent: 70 min  GARDNER, JARED M. [facility] Pager [phone]  If 7AM-7PM, please contact the day team taking care of the patient [email or web address] Password [email or web address] 08/19/2014, 8:02 PM

## 2014-08-19 NOTE — ED Notes (Addendum)
Pt reports she has not urinated in 2 days and has developed sob and swelling in bilateral LE. Pt also c/o lower back pain that has been on going for months.

## 2014-08-20 ENCOUNTER — Encounter (HOSPITAL_COMMUNITY): Payer: Self-pay | Admitting: General Practice

## 2014-08-20 DIAGNOSIS — R609 Edema, unspecified: Secondary | ICD-10-CM

## 2014-08-20 LAB — BASIC METABOLIC PANEL
ANION GAP: 9 (ref 5–15)
BUN: 18 mg/dL (ref 6–23)
CO2: 31 mmol/L (ref 19–32)
Calcium: 9.2 mg/dL (ref 8.4–10.5)
Chloride: 103 mEq/L (ref 96–112)
Creatinine, Ser: 1.06 mg/dL (ref 0.50–1.10)
GFR calc non Af Amer: 49 mL/min — ABNORMAL LOW (ref >90)
GFR, EST AFRICAN AMERICAN: 56 mL/min — AB (ref >90)
Glucose, Bld: 87 mg/dL (ref 70–99)
POTASSIUM: 4.5 mmol/L (ref 3.5–5.1)
SODIUM: 143 mmol/L (ref 135–145)

## 2014-08-20 LAB — GLUCOSE, CAPILLARY
Glucose-Capillary: 133 mg/dL — ABNORMAL HIGH (ref 70–99)
Glucose-Capillary: 147 mg/dL — ABNORMAL HIGH (ref 70–99)

## 2014-08-20 MED ORDER — FUROSEMIDE 10 MG/ML IJ SOLN
20.0000 mg | Freq: Two times a day (BID) | INTRAMUSCULAR | Status: DC
  Administered 2014-08-20 – 2014-08-21 (×3): 20 mg via INTRAVENOUS
  Filled 2014-08-20 (×3): qty 2

## 2014-08-20 MED ORDER — INSULIN ASPART 100 UNIT/ML ~~LOC~~ SOLN
0.0000 [IU] | Freq: Three times a day (TID) | SUBCUTANEOUS | Status: DC
  Administered 2014-08-20 – 2014-08-21 (×2): 1 [IU] via SUBCUTANEOUS
  Administered 2014-08-21 – 2014-08-22 (×2): 2 [IU] via SUBCUTANEOUS
  Administered 2014-08-22 – 2014-08-23 (×3): 1 [IU] via SUBCUTANEOUS

## 2014-08-20 NOTE — Evaluation (Signed)
Physical Therapy Evaluation Patient Details Name: Monique Lewis M Plouffe MRN: [id number] DOB: 10-11-1932 Today's Date: 08/20/2014   History of Present Illness  Pt adm with acute on chronic CHF. PMH - CHF, COPD, HTN, DM, Bil TKA  Clinical Impression  Pt doing well with mobility and no further PT needed.  Ready for dc from PT standpoint. Recommend pt continue to amb in hallways throughout her hospitalization.      Follow Up Recommendations No PT follow up    Equipment Recommendations  None recommended by PT    Recommendations for Other Services       Precautions / Restrictions Precautions Precautions: None      Mobility  Bed Mobility Overal bed mobility: Modified Independent                Transfers Overall transfer level: Modified independent                  Ambulation/Gait Ambulation/Gait assistance: Modified independent (Device/Increase time) Ambulation Distance (Feet): 200 Feet Assistive device: Rolling walker (2 wheeled) Gait Pattern/deviations: Step-through pattern;Decreased stride length     General Gait Details: steady gait.  Stairs            Wheelchair Mobility    Modified Rankin (Stroke Patients Only)       Balance Overall balance assessment: No apparent balance deficits (not formally assessed)                                           Pertinent Vitals/Pain Pain Assessment: No/denies pain    Home Living Family/patient expects to be discharged to:: Private residence Living Arrangements: Alone Available Help at Discharge:  (3 hrs M-F) Type of Home: Apartment (senior building) Home Access: Elevator     Home Layout: One level Home Equipment: Environmental consultantWalker - 2 wheels;Walker - standard      Prior Function Level of Independence: Needs assistance   Gait / Transfers Assistance Needed: Amb independently with or without walker.  ADL's / Homemaking Assistance Needed: Has aide to assist.        Hand Dominance   Dominant Hand: Right    Extremity/Trunk Assessment   Upper Extremity Assessment: Overall WFL for tasks assessed           Lower Extremity Assessment: Overall WFL for tasks assessed         Communication   Communication: No difficulties  Cognition Arousal/Alertness: Awake/alert Behavior During Therapy: WFL for tasks assessed/performed Overall Cognitive Status: Within Functional Limits for tasks assessed                      General Comments      Exercises        Assessment/Plan    PT Assessment Patent does not need any further PT services  PT Diagnosis Difficulty walking   PT Problem List    PT Treatment Interventions     PT Goals (Current goals can be found in the Care Plan section) Acute Rehab PT Goals PT Goal Formulation: All assessment and education complete, DC therapy    Frequency     Barriers to discharge        Co-evaluation               End of Session   Activity Tolerance: Patient tolerated treatment well Patient left: in bed;with call bell/phone within reach  Time: 1610-9604 PT Time Calculation (min) (ACUTE ONLY): 16 min   Charges:   PT Evaluation $Initial PT Evaluation Tier I: 1 Procedure     PT G Codes:        Tawana Pasch 09-07-2014, 3:09 PM  [facility] PT [phone]

## 2014-08-20 NOTE — Progress Notes (Addendum)
UR completed Arantxa Piercey K. Ad Guttman, RN, BSN, MSHL, CCM  08/20/2014 11:50 AM

## 2014-08-20 NOTE — Progress Notes (Signed)
*  Preliminary Results* Bilateral lower extremity venous duplex completed. No evidence of deep vein thrombosis involving the visualized veins of the right lower extremity and left lower extremity; the patient could not tolerate most compression maneuvers therefore this only applies to those compressed veins. No evidence of Baker's cyst bilaterally.  08/20/2014  Gertie FeyMichelle Brynley Cuddeback, RVT, RDCS, RDMS

## 2014-08-20 NOTE — Progress Notes (Signed)
[facility] PROGRESS NOTE  Monique Lewis NWG:[id number] DOB: [birth year] DOA: 08/19/2014 PCP: Rich Numbersei A Bonsu, DO  Assessment/Plan: 81 y/o female with PMH of HTN, GERD, DJD, DM, COPD, CHF presented with SOB, DOE with worsening leg edema   1. Acute on chronic CHF, diastolic CHF; CXR: cardiomegaly, emphysema, pulmonary vascular congestion; Lung exam: no significant congestion, but +JVD, leg edema; BnP-56; ? RHF but echo (06/2014): LVEF 65-70%, normal RV -cont IV diuresis, monitor I/O, daily weight; replace lytes   2. DOE, h/o COPD; CXR: no clear infiltrates; exam no wheezing; cont bronchodilators; needs PFTs as outpatient  3. BL leg edema in the setting of chronic chronic CHF; cont diuresis; obtain US r/o DVT  4. HTN, resumed BB, clonidine (? Once daily); may consider d/c  Clonidine if BP is stable  5. DM, on home dose glipizide; cont ISS, check ha1c   Code Status: full Family Communication: d/w patient (indicate person spoken with, relationship, and if by phone, the number) Disposition Plan: home pend clinical improvement    Consultants:  none  Procedures:  noe  Antibiotics:  none (indicate start date, and stop date if known)  HPI/Subjective: alert  Objective: Filed Vitals:   08/20/14 0520  BP: 145/52  Pulse: 61  Temp: 97.8 F (36.6 C)  Resp: 18    Intake/Output Summary (Last 24 hours) at 08/20/14 0827 Last data filed at 08/20/14 0600  Gross per 24 hour  Intake    540 ml  Output   1940 ml  Net  -1400 ml   Filed Weights   08/19/14 1647 08/19/14 2059 08/20/14 0520  Weight: 91.627 kg (202 lb) 97.1 kg (214 lb 1.1 oz) 97 kg (213 lb 13.5 oz)    Exam:   General:  alert  Cardiovascular: s1,s2 rrr, + JVD  Respiratory: no wheezing, fewcrackles   Abdomen: soft, nt,nd   Musculoskeletal: + edema   Data Reviewed: Basic Metabolic Panel:  Recent Labs Lab 08/19/14 1652 08/20/14 0352  NA 137 143  K 4.8 4.5  CL 100 103  CO2 27 31  GLUCOSE 150* 87   BUN 26* 18  CREATININE 1.33* 1.06  CALCIUM 9.0 9.2   Liver Function Tests: No results for input(s): AST, ALT, ALKPHOS, BILITOT, PROT, ALBUMIN in the last 168 hours. No results for input(s): LIPASE, AMYLASE in the last 168 hours. No results for input(s): AMMONIA in the last 168 hours. CBC:  Recent Labs Lab 08/19/14 1652  WBC 6.2  HGB 10.0*  HCT 32.8*  MCV 76.6*  PLT 262   Cardiac Enzymes:  Recent Labs Lab 08/19/14 1652  TROPONINI <0.03   BNP (last 3 results)  Recent Labs  07/06/14 1219  PROBNP 184.5   CBG:  Recent Labs Lab 08/19/14 2053  GLUCAP 81    No results found for this or any previous visit (from the past 240 hour(s)).   Studies: Dg Chest 2 View  08/19/2014   CLINICAL DATA:  BILATERAL feet and leg swelling, shortness of breath, mid chest pain and dizziness since last week, history hypertension, diabetes, asthma, COPD, CHF  EXAM: CHEST  2 VIEW  COMPARISON:  07/06/2014  FINDINGS: Enlargement of cardiac silhouette with slight vascular congestion.  Large hiatal hernia.  Mediastinal contours otherwise stable.  After sclerotic calcification aorta.  No acute edema or segmental consolidation.  Emphysematous changes with minimal basilar atelectasis.  Cannot exclude small RIGHT subpulmonic pleural effusion.  No pneumothorax.  Levoconvex thoracolumbar scoliosis.  IMPRESSION: Enlargement of cardiac silhouette with pulmonary vascular congestion.  Emphysematous changes with minimal bibasilar atelectasis and questionable small RIGHT pleural effusion.  No acute infiltrate.   Electronically Signed   By: Ulyses Southward M.D.   On: 08/19/2014 17:15   US Renal  08/19/2014   CLINICAL DATA:  Acute renal insufficiency.  EXAM: RENAL/URINARY TRACT ULTRASOUND COMPLETE  COMPARISON:  CT abdomen and pelvis 05/18/2012.  FINDINGS: Right Kidney:  Length: 9.6 cm. Echogenicity within normal limits. No mass or hydronephrosis visualized.  Left Kidney:  Length: 10.5 cm. Echogenicity within normal  limits. No mass or hydronephrosis visualized.  Bladder:  Appears normal for degree of bladder distention.  IMPRESSION: Negative for hydronephrosis.  Negative exam.   Electronically Signed   By: Drusilla Kanner M.D.   On: 08/19/2014 20:48    Scheduled Meds: . budesonide-formoterol  2 puff Inhalation BID  . carvedilol  3.125 mg Oral Daily  . clobetasol cream  1 application Topical BID  . cloNIDine  0.1 mg Oral Daily  . doxepin  50 mg Oral QHS  . escitalopram  10 mg Oral Daily  . fluticasone  2 spray Each Nare Daily  . furosemide  40 mg Oral Daily  . glipiZIDE  5 mg Oral Daily  . heparin  5,000 Units Subcutaneous 3 times per day  . loratadine  10 mg Oral Daily  . pantoprazole  40 mg Oral Daily  . potassium chloride SA  20 mEq Oral BID  . sodium chloride  3 mL Intravenous Q12H   Continuous Infusions:   Principal Problem:   Acute on chronic diastolic congestive heart failure Active Problems:   HTN (hypertension)   Lower extremity edema   DM2 (diabetes mellitus, type 2)    Time spent: >35 minutes     Esperanza Sheets  [facility] Pager [phone]. If 7PM-7AM, please contact night-coverage at [email or web address], password [facility] 08/20/2014, 8:27 AM  LOS: 1 day

## 2014-08-20 NOTE — Care Management Note (Addendum)
    Page 1 of 2   08/23/2014     5:12:14 PM CARE MANAGEMENT NOTE 08/23/2014  Patient:  Monique Lewis,Monique Lewis   Account Number:  [id number]  Date Initiated:  08/20/2014  Documentation initiated by:  HUTCHINSON,CRYSTAL  Subjective/Objective Assessment:   CHF, BLE edema, orthopnea  Hx/o drinking too much fluid.     Action/Plan:   CM to follow for disposition needs   Anticipated DC Date:  08/23/2014   Anticipated DC Plan:  HOME W HOME HEALTH SERVICES      DC Planning Services  CM consult      [facility]  Resumption Of Svcs/PTA Provider   Choice offered to / List presented to:  C-1 Patient        HH arranged  HH-1 RN  HH-10 DISEASE MANAGEMENT  HH-2 PT      HH agency  [facility]   Status of service:  Completed, signed off Medicare Important Message given?  YES (If response is "NO", the following Medicare IM given date fields will be blank) Date Medicare IM given:  08/23/2014 Medicare IM given by:  Zayvon Alicea Date Additional Medicare IM given:   Additional Medicare IM given by:    Discharge Disposition:  HOME W HOME HEALTH SERVICES  Per UR Regulation:  Reviewed for med. necessity/level of care/duration of stay  If discussed at Long Length of Stay Meetings, dates discussed:    Comments:  08/23/14 Sidney AceJulie Taelyr Jantz, RN, BSN [phone] Pt for dc home today; [facility]. Start of care 24-48h post dc date.   Crystal Hutchinson RN, BSN, MSHL, CCM  Nurse - Case Manager,  ([email or web address]) [phone]  08/20/2014 Hx/ 2 admissions over the past 6 months.  H&P confirms unmanaged CHF symptoms at home and drinking too much fluids. CM identifies need for extended CHF education to improve self mgmt. Hx/o past HHS with [facility]. Dispositon Plan:  home with HHS:  RN (CHF mgmt) ([facility]/Donna notified) PT RECS:  pending

## 2014-08-21 DIAGNOSIS — I272 Pulmonary hypertension, unspecified: Secondary | ICD-10-CM | POA: Diagnosis present

## 2014-08-21 DIAGNOSIS — R001 Bradycardia, unspecified: Secondary | ICD-10-CM

## 2014-08-21 DIAGNOSIS — I10 Essential (primary) hypertension: Secondary | ICD-10-CM | POA: Diagnosis present

## 2014-08-21 DIAGNOSIS — E119 Type 2 diabetes mellitus without complications: Secondary | ICD-10-CM | POA: Diagnosis present

## 2014-08-21 DIAGNOSIS — I270 Primary pulmonary hypertension: Secondary | ICD-10-CM

## 2014-08-21 LAB — HEMOGLOBIN A1C
Hgb A1c MFr Bld: 7 % — ABNORMAL HIGH (ref <5.7)
MEAN PLASMA GLUCOSE: 154 mg/dL — AB (ref <117)

## 2014-08-21 LAB — CBC
HEMATOCRIT: 31.8 % — AB (ref 36.0–46.0)
Hemoglobin: 9.8 g/dL — ABNORMAL LOW (ref 12.0–15.0)
MCH: 23.3 pg — AB (ref 26.0–34.0)
MCHC: 30.8 g/dL (ref 30.0–36.0)
MCV: 75.7 fL — ABNORMAL LOW (ref 78.0–100.0)
PLATELETS: 244 10*3/uL (ref 150–400)
RBC: 4.2 MIL/uL (ref 3.87–5.11)
RDW: 21 % — ABNORMAL HIGH (ref 11.5–15.5)
WBC: 5.6 10*3/uL (ref 4.0–10.5)

## 2014-08-21 LAB — BASIC METABOLIC PANEL
ANION GAP: 11 (ref 5–15)
BUN: 19 mg/dL (ref 6–23)
CALCIUM: 8.9 mg/dL (ref 8.4–10.5)
CHLORIDE: 101 mmol/L (ref 96–112)
CO2: 26 mmol/L (ref 19–32)
Creatinine, Ser: 0.98 mg/dL (ref 0.50–1.10)
GFR calc non Af Amer: 53 mL/min — ABNORMAL LOW (ref >90)
GFR, EST AFRICAN AMERICAN: 62 mL/min — AB (ref >90)
Glucose, Bld: 114 mg/dL — ABNORMAL HIGH (ref 70–99)
POTASSIUM: 4.6 mmol/L (ref 3.5–5.1)
Sodium: 138 mmol/L (ref 135–145)

## 2014-08-21 LAB — GLUCOSE, CAPILLARY
GLUCOSE-CAPILLARY: 192 mg/dL — AB (ref 70–99)
Glucose-Capillary: 118 mg/dL — ABNORMAL HIGH (ref 70–99)
Glucose-Capillary: 133 mg/dL — ABNORMAL HIGH (ref 70–99)

## 2014-08-21 LAB — LIPID PANEL
Cholesterol: 130 mg/dL (ref 0–200)
HDL: 50 mg/dL (ref >39)
LDL Cholesterol: 67 mg/dL (ref 0–99)
TRIGLYCERIDES: 63 mg/dL (ref <150)
Total CHOL/HDL Ratio: 2.6 RATIO
VLDL: 13 mg/dL (ref 0–40)

## 2014-08-21 MED ORDER — FUROSEMIDE 40 MG PO TABS
40.0000 mg | ORAL_TABLET | Freq: Two times a day (BID) | ORAL | Status: DC
  Administered 2014-08-21 – 2014-08-22 (×2): 40 mg via ORAL
  Filled 2014-08-21 (×4): qty 1

## 2014-08-21 MED ORDER — CARVEDILOL 3.125 MG PO TABS
3.1250 mg | ORAL_TABLET | Freq: Two times a day (BID) | ORAL | Status: DC
  Administered 2014-08-21 – 2014-08-23 (×5): 3.125 mg via ORAL
  Filled 2014-08-21 (×6): qty 1

## 2014-08-21 NOTE — Progress Notes (Signed)
[facility] PROGRESS NOTE  VERDIS BASSETTE ZOX:[id number] DOB: 1932-08-20 DOA: 08/19/2014 PCP: Cloyd Stagers A Bonsu, DO  Assessment/Plan: Acute on chronic diastolic CHF  -Reviewed all lab findings -Strict I&O since admission; -2.9 L -Daily weight admission weight standing = 97.1 kg          1/23 standing weight= 93.8 kg -Lasix 40 mg BID  -Continue Coreg 3.125 mg BID, will have to watch patient's bradycardia/ensure she does not become hypotensive  -Patient unsure of her dry weight will continue to diuresis until creatinine begins to trend up -Renal ultrasound pending  HTN -See acute on chronic diastolic CHF  Bradycardia -See acute on chronic diastolic CHF  Pulmonary hypertension -See acute on chronic diastolic CHF  Diabetes type 2 controlled? -hemoglobin A1c pending -Obtain lipid panel -Continue sensitive SSI   Code Status: Full  Family Communication: None Disposition Plan: Resolution of decompensated CHF; in 24-48 hours   Consultants: None   Procedures: 07/07/14 echocardiogram;- Left ventricle:  Mild concentric hypertrophy. LVEF= 65% to 70%.  -(grade 1 diastolic dysfunction). -  Left atrium: moderately dilated. - Pulmonary arteries: PA peak pressure: 35 mm Hg (S). 1/22 bilateral lower extremity Doppler;No evidence of DVT;patient could not tolerate most compression maneuvers therefore this only applies to those compressed veins. - No evidence of Baker&'s cyst on the right or left.  Cultures NA  Antibiotics: NA  DVT prophylaxis Heparin subcutaneous    HPI/Subjective: DEION FORGUE is a 81 y.o. BF PMHx depression, diastolic CHF HTN, pulmonary hypertension, heart murmur, DOE, asthma/COPD, diabetes type 2 chronic low back pain, gout, hepatitis C.  Patient presents with worsening BLE edema, DOE, and orthopnea over the past week or so. This has been persistent. Patient admits that she drank "way to much fluid" last week and she associates her increased PO intake  with increased swelling. She has also noted decreased UOP over the past couple of days as well. 1/23 states feels improved,. Initially was positive SOB, unable to urinate, retaining fluid. Unsure of her base weight but states visiting cardiologist a couple of weeks ago and her weight was 206 pounds    Objective: Filed Vitals:   08/21/14 0133 08/21/14 0507 08/21/14 0909 08/21/14 1104  BP: 146/56 153/70  166/55  Pulse: 58 62  57  Temp: 98 F (36.7 C) 98.5 F (36.9 C)    TempSrc: Oral Oral    Resp: Height:      Weight:  93.804 kg (206 lb 12.8 oz)    SpO2: 100% 97% 98% 100%    Intake/Output Summary (Last 24 hours) at 08/21/14 1235 Last data filed at 08/21/14 1106  Gross per 24 hour  Intake   1053 ml  Output   2450 ml  Net  -1397 ml   Filed Weights   08/19/14 2059 08/20/14 0520 08/21/14 0507  Weight: 97.1 kg (214 lb 1.1 oz) 97 kg (213 lb 13.5 oz) 93.804 kg (206 lb 12.8 oz)     Exam: General: A/O 4, NAD, No acute respiratory distress Lungs: Clear to auscultation bilaterally without wheezes or crackles Cardiovascular: Regular rate and rhythm positive grade 2/6 systolic murmur, negative gallop or rub normal S1 and S2 Abdomen: Nontender, nondistended, soft, bowel sounds positive, no rebound, no ascites, no appreciable mass Extremities: No significant cyanosis, clubbing. Bilateral lower extremity edema 1-2+Rt> Lt    Data Reviewed: Basic Metabolic Panel:  Recent Labs Lab 08/19/14 1652 08/20/14 0352 08/21/14 0312  NA 137 143 138  K 4.8 4.5  4.6  CL 100 103 101  CO2 GLUCOSE 150* 87 114*  BUN 26* 18 19  CREATININE 1.33* 1.06 0.98  CALCIUM 9.0 9.2 8.9   Liver Function Tests: No results for input(s): AST, ALT, ALKPHOS, BILITOT, PROT, ALBUMIN in the last 168 hours. No results for input(s): LIPASE, AMYLASE in the last 168 hours. No results for input(s): AMMONIA in the last 168 hours. CBC:  Recent Labs Lab 08/19/14 1652 08/21/14 0312  WBC 6.2 5.6   HGB 10.0* 9.8*  HCT 32.8* 31.8*  MCV 76.6* 75.7*  PLT 262 244   Cardiac Enzymes:  Recent Labs Lab 08/19/14 1652  TROPONINI <0.03   BNP (last 3 results)  Recent Labs  07/06/14 1219  PROBNP 184.5   CBG:  Recent Labs Lab 08/19/14 2053 08/20/14 1723 08/20/14 2110 08/21/14 0507 08/21/14 1154  GLUCAP 81 133* 147* 118* 133*    No results found for this or any previous visit (from the past 240 hour(s)).   Studies: Dg Chest 2 View  08/19/2014   CLINICAL DATA:  BILATERAL feet and leg swelling, shortness of breath, mid chest pain and dizziness since last week, history hypertension, diabetes, asthma, COPD, CHF  EXAM: CHEST  2 VIEW  COMPARISON:  07/06/2014  FINDINGS: Enlargement of cardiac silhouette with slight vascular congestion.  Large hiatal hernia.  Mediastinal contours otherwise stable.  After sclerotic calcification aorta.  No acute edema or segmental consolidation.  Emphysematous changes with minimal basilar atelectasis.  Cannot exclude small RIGHT subpulmonic pleural effusion.  No pneumothorax.  Levoconvex thoracolumbar scoliosis.  IMPRESSION: Enlargement of cardiac silhouette with pulmonary vascular congestion.  Emphysematous changes with minimal bibasilar atelectasis and questionable small RIGHT pleural effusion.  No acute infiltrate.   Electronically Signed   By: Ulyses Southward M.D.   On: 08/19/2014 17:15   US Renal  08/19/2014   CLINICAL DATA:  Acute renal insufficiency.  EXAM: RENAL/URINARY TRACT ULTRASOUND COMPLETE  COMPARISON:  CT abdomen and pelvis 05/18/2012.  FINDINGS: Right Kidney:  Length: 9.6 cm. Echogenicity within normal limits. No mass or hydronephrosis visualized.  Left Kidney:  Length: 10.5 cm. Echogenicity within normal limits. No mass or hydronephrosis visualized.  Bladder:  Appears normal for degree of bladder distention.  IMPRESSION: Negative for hydronephrosis.  Negative exam.   Electronically Signed   By: Drusilla Kanner M.D.   On: 08/19/2014 20:48     Scheduled Meds: . budesonide-formoterol  2 puff Inhalation BID  . carvedilol  3.125 mg Oral Daily  . clobetasol cream  1 application Topical BID  . cloNIDine  0.1 mg Oral Daily  . doxepin  50 mg Oral QHS  . escitalopram  10 mg Oral Daily  . fluticasone  2 spray Each Nare Daily  . furosemide  20 mg Intravenous Q12H  . furosemide  40 mg Oral Daily  . heparin  5,000 Units Subcutaneous 3 times per day  . insulin aspart  0-9 Units Subcutaneous TID WC  . loratadine  10 mg Oral Daily  . pantoprazole  40 mg Oral Daily  . potassium chloride SA  20 mEq Oral BID  . sodium chloride  3 mL Intravenous Q12H   Continuous Infusions:   Principal Problem:   Acute on chronic diastolic congestive heart failure Active Problems:   HTN (hypertension)   Lower extremity edema   DM2 (diabetes mellitus, type 2)    Time spent: 40 minutes    WOODS, CURTIS, J  [facility] Pager [phone]. If 7PM-7AM, please contact  night-coverage at [email or web address], password [email or web address] 08/21/2014, 12:35 PM  LOS: 2 days

## 2014-08-22 DIAGNOSIS — R197 Diarrhea, unspecified: Secondary | ICD-10-CM

## 2014-08-22 DIAGNOSIS — R110 Nausea: Secondary | ICD-10-CM | POA: Diagnosis present

## 2014-08-22 LAB — BASIC METABOLIC PANEL
Anion gap: 8 (ref 5–15)
BUN: 19 mg/dL (ref 6–23)
CALCIUM: 8.7 mg/dL (ref 8.4–10.5)
CO2: 32 mmol/L (ref 19–32)
Chloride: 98 mmol/L (ref 96–112)
Creatinine, Ser: 0.98 mg/dL (ref 0.50–1.10)
GFR calc non Af Amer: 53 mL/min — ABNORMAL LOW (ref >90)
GFR, EST AFRICAN AMERICAN: 62 mL/min — AB (ref >90)
Glucose, Bld: 99 mg/dL (ref 70–99)
Potassium: 4 mmol/L (ref 3.5–5.1)
SODIUM: 138 mmol/L (ref 135–145)

## 2014-08-22 LAB — GLUCOSE, CAPILLARY
GLUCOSE-CAPILLARY: 91 mg/dL (ref 70–99)
GLUCOSE-CAPILLARY: 157 mg/dL — AB (ref 70–99)
GLUCOSE-CAPILLARY: 140 mg/dL — AB (ref 70–99)
GLUCOSE-CAPILLARY: 144 mg/dL — AB (ref 70–99)
Glucose-Capillary: 111 mg/dL — ABNORMAL HIGH (ref 70–99)

## 2014-08-22 MED ORDER — FUROSEMIDE 40 MG PO TABS
60.0000 mg | ORAL_TABLET | Freq: Two times a day (BID) | ORAL | Status: DC
  Administered 2014-08-22 – 2014-08-23 (×2): 60 mg via ORAL
  Filled 2014-08-22 (×4): qty 1

## 2014-08-22 MED ORDER — ONDANSETRON HCL 4 MG/2ML IJ SOLN
4.0000 mg | Freq: Three times a day (TID) | INTRAMUSCULAR | Status: DC
  Administered 2014-08-22 – 2014-08-23 (×3): 4 mg via INTRAVENOUS
  Filled 2014-08-22 (×3): qty 2

## 2014-08-22 NOTE — Progress Notes (Signed)
[facility] PROGRESS NOTE  Monique Lewis WGN:[id number] DOB: [birth year] DOA: 08/19/2014 PCP: Cloyd Stagerssei A Bonsu, DO  Assessment/Plan: Acute on chronic diastolic CHF  -Reviewed all lab findings -Strict I&O since admission; -3.1L -Daily weight admission weight standing = 97.1 kg          1/23 standing weight= 92.1kg -Increase Lasix 60 mg BID  -Continue Coreg 3.125 mg BID, will have to watch patient's bradycardia/ensure she does not become hypotensive  -Patient unsure of her dry weight will continue to diuresis until creatinine begins to trend up -Renal ultrasound; nondiagnostic see results below  HTN -See acute on chronic diastolic CHF  Bradycardia -See acute on chronic diastolic CHF  Pulmonary hypertension -See acute on chronic diastolic CHF  Diabetes type 2 controlled -1/23 hemoglobin A1c = 7.0  -lipid panel; within ADA guideline -Continue sensitive SSI  Nausea and diarrhea -Zofran IV 4 mg TID -C. difficile by PCR -Stool pathogen panel -Stool WBC pending   Code Status: Full  Family Communication: None Disposition Plan: Resolution of decompensated CHF; in 24-48 hours   Consultants: None   Procedures: 07/07/14 echocardiogram;- Left ventricle:  Mild concentric hypertrophy. LVEF= 65% to 70%.  -(grade 1 diastolic dysfunction). -  Left atrium: moderately dilated. - Pulmonary arteries: PA peak pressure: 35 mm Hg (S). 1/21 renal ultrasound; negative exam 1/22 bilateral lower extremity Doppler;No evidence of DVT;patient could not tolerate most compression maneuvers therefore this only applies to those compressed veins. - No evidence of Baker&'s cyst on the right or left.  Cultures 1/24 C. difficile by PCR pending 1/24 stool pathogen panel pending  Antibiotics: NA  DVT prophylaxis Heparin subcutaneous    HPI/Subjective: Monique Dollarlsie M Kasal is a 81 y.o. BF PMHx depression, diastolic CHF HTN, pulmonary hypertension, heart murmur, DOE, asthma/COPD, diabetes type 2  chronic low back pain, gout, hepatitis C.  Patient presents with worsening BLE edema, DOE, and orthopnea over the past week or so. This has been persistent. Patient admits that she drank "way to much fluid" last week and she associates her increased PO intake with increased swelling. She has also noted decreased UOP over the past couple of days as well. 1/24 states feels improved,. Negative SOB, negative DOE, positive N/D new-onset      Objective: Filed Vitals:   08/22/14 0803 08/22/14 1031 08/22/14 1300 08/22/14 1655  BP:   153/55   Pulse:   58 56  Temp:  97.8 F (36.6 C) 97.4 F (36.3 C)   TempSrc:   Oral   Resp:   18   Height:      Weight:      SpO2: 96%  98%     Intake/Output Summary (Last 24 hours) at 08/22/14 1809 Last data filed at 08/22/14 1506  Gross per 24 hour  Intake   1263 ml  Output   1100 ml  Net    163 ml   Filed Weights   08/20/14 0520 08/21/14 0507 08/22/14 0512  Weight: 97 kg (213 lb 13.5 oz) 93.804 kg (206 lb 12.8 oz) 92.171 kg (203 lb 3.2 oz)     Exam: General: A/O 4, NAD, No acute respiratory distress Lungs: Clear to auscultation bilaterally without wheezes or crackles Cardiovascular: Regular rate and rhythm positive grade 2/6 systolic murmur, negative gallop or rub normal S1 and S2 Abdomen: Nontender, nondistended, soft, bowel sounds positive, no rebound, no ascites, no appreciable mass Extremities: No significant cyanosis, clubbing. Bilateral lower extremity edema 1-2+Rt> Lt    Data Reviewed: Basic Metabolic Panel:  Recent Labs Lab 08/19/14 1652 08/20/14 0352 08/21/14 0312 08/22/14 0458  NA 137 143 138 138  K 4.8 4.5 4.6 4.0  CL 100 103 101 98  CO2 32  GLUCOSE 150* 87 114* 99  BUN 26* CREATININE 1.33* 1.06 0.98 0.98  CALCIUM 9.0 9.2 8.9 8.7   Liver Function Tests: No results for input(s): AST, ALT, ALKPHOS, BILITOT, PROT, ALBUMIN in the last 168 hours. No results for input(s): LIPASE, AMYLASE in the last  168 hours. No results for input(s): AMMONIA in the last 168 hours. CBC:  Recent Labs Lab 08/19/14 1652 08/21/14 0312  WBC 6.2 5.6  HGB 10.0* 9.8*  HCT 32.8* 31.8*  MCV 76.6* 75.7*  PLT 262 244   Cardiac Enzymes:  Recent Labs Lab 08/19/14 1652  TROPONINI <0.03   BNP (last 3 results)  Recent Labs  07/06/14 1219  PROBNP 184.5   CBG:  Recent Labs Lab 08/21/14 1611 08/21/14 2103 08/22/14 0544 08/22/14 1208 08/22/14 1639  GLUCAP 192* 91 111* 157* 140*    No results found for this or any previous visit (from the past 240 hour(s)).   Studies: No results found.  Scheduled Meds: . budesonide-formoterol  2 puff Inhalation BID  . carvedilol  3.125 mg Oral BID WC  . clobetasol cream  1 application Topical BID  . cloNIDine  0.1 mg Oral Daily  . doxepin  50 mg Oral QHS  . escitalopram  10 mg Oral Daily  . fluticasone  2 spray Each Nare Daily  . furosemide  60 mg Oral BID  . heparin  5,000 Units Subcutaneous 3 times per day  . insulin aspart  0-9 Units Subcutaneous TID WC  . loratadine  10 mg Oral Daily  . ondansetron (ZOFRAN) IV  4 mg Intravenous 3 times per day  . pantoprazole  40 mg Oral Daily  . potassium chloride SA  20 mEq Oral BID  . sodium chloride  3 mL Intravenous Q12H   Continuous Infusions:   Principal Problem:   Acute on chronic diastolic congestive heart failure Active Problems:   HTN (hypertension)   Lower extremity edema   DM2 (diabetes mellitus, type 2)   Diabetes type 2, controlled   Essential hypertension   Pulmonary hypertension   Nausea without vomiting   Diarrhea    Time spent: 40 minutes    WOODS, CURTIS, J  [facility] Pager [phone]. If 7PM-7AM, please contact night-coverage at [email or web address], password [facility] 08/22/2014, 6:09 PM  LOS: 3 days    Care during the described time interval was provided by me. I have reviewed this patient's available data, including medical history, events of note, physical examination,  radiology findings and test results as part of my evaluation  Carolyne Littles, MD [phone] Pager

## 2014-08-23 LAB — GLUCOSE, CAPILLARY
Glucose-Capillary: 111 mg/dL — ABNORMAL HIGH (ref 70–99)
Glucose-Capillary: 146 mg/dL — ABNORMAL HIGH (ref 70–99)
Glucose-Capillary: 132 mg/dL — ABNORMAL HIGH (ref 70–99)

## 2014-08-23 MED ORDER — CLONIDINE HCL 0.1 MG PO TABS: 0.1000 mg | ORAL_TABLET | Freq: Every day | ORAL | Status: DC

## 2014-08-23 MED ORDER — FUROSEMIDE 40 MG PO TABS: 40.0000 mg | ORAL_TABLET | Freq: Two times a day (BID) | ORAL | Status: DC

## 2014-08-23 MED ORDER — PROMETHAZINE HCL 12.5 MG PO TABS: 12.5000 mg | ORAL_TABLET | Freq: Four times a day (QID) | ORAL | Status: DC | PRN

## 2014-08-23 MED ORDER — CLOBETASOL PROPIONATE 0.05 % EX CREA: 1.0000 "application " | TOPICAL_CREAM | Freq: Two times a day (BID) | CUTANEOUS | Status: AC

## 2014-08-23 MED ORDER — TRAMADOL HCL 50 MG PO TABS: 50.0000 mg | ORAL_TABLET | Freq: Three times a day (TID) | ORAL | Status: DC | PRN

## 2014-08-23 MED ORDER — FUROSEMIDE 40 MG PO TABS
40.0000 mg | ORAL_TABLET | Freq: Two times a day (BID) | ORAL | Status: DC
  Administered 2014-08-23: 40 mg via ORAL
  Filled 2014-08-23 (×2): qty 1

## 2014-08-23 NOTE — Discharge Summary (Signed)
Physician Discharge Summary  Patient ID: Monique Lewis MRN: [id number] DOB/AGE: 1932-08-14 81 y.o.  Admit date: 08/19/2014 Discharge date: 08/23/2014  Primary Care Physician:  Rich Number Bonsu, DO  Discharge Diagnoses:    . Lower extremity edema . Acute on chronic diastolic congestive heart failure . HTN (hypertension) . Diabetes type 2, controlled . Essential hypertension . Pulmonary hypertension . Nausea without vomiting . Diarrhea resolved  Consults:  Cardiology Dr. Algie Coffer  Recommendations for Outpatient Follow-up:  Please note Lasix was increased to 40 mg twice a day at the time of discharge  TESTS THAT NEED FOLLOW-UP BMET   DIET: Heart healthy diet, 2 g sodium    Allergies:   Allergies  Allergen Reactions  . Celebrex [Celecoxib] Itching  . Sulfa Antibiotics Itching  . Codeine Itching  . Shellfish Allergy Itching  . Iodine Rash  . Latex Rash     Discharge Medications:   Medication List    TAKE these medications        albuterol 108 (90 BASE) MCG/ACT inhaler  Commonly known as:  PROVENTIL HFA;VENTOLIN HFA  Inhale 2 puffs into the lungs every 4 (four) hours as needed. For wheezing     budesonide-formoterol 160-4.5 MCG/ACT inhaler  Commonly known as:  SYMBICORT  Inhale 2 puffs into the lungs 2 (two) times daily.     carvedilol 3.125 MG tablet  Commonly known as:  COREG  Take 3.125 mg by mouth daily.     clobetasol cream 0.05 %  Commonly known as:  TEMOVATE  Apply 1 application topically 2 (two) times daily.     cloNIDine 0.1 MG tablet  Commonly known as:  CATAPRES     desonide 0.05 % cream  Commonly known as:  DESOWEN     doxepin 50 MG capsule  Commonly known as:  SINEQUAN  Take 50 mg by mouth at bedtime.     escitalopram 10 MG tablet  Commonly known as:  LEXAPRO  Take 10 mg by mouth daily.     esomeprazole 40 MG capsule  Commonly known as:  NEXIUM  Take 40 mg by mouth daily before breakfast.     fluticasone 50 MCG/ACT nasal spray   Commonly known as:  FLONASE  Place 2 sprays into the nose daily.     furosemide 40 MG tablet  Commonly known as:  LASIX  Take 1 tablet (40 mg total) by mouth 2 (two) times daily.     glipiZIDE 5 MG 24 hr tablet  Commonly known as:  GLUCOTROL XL  Take 1 tablet by mouth daily.     hydrOXYzine 25 MG tablet  Commonly known as:  ATARAX/VISTARIL  Take 25 mg by mouth every 4 (four) hours as needed. For itching     ketoconazole 2 % cream  Commonly known as:  NIZORAL     loratadine 10 MG tablet  Commonly known as:  CLARITIN  Take 10 mg by mouth daily.     potassium chloride SA 20 MEQ tablet  Commonly known as:  K-DUR,KLOR-CON  Take 1 tablet (20 mEq total) by mouth 2 (two) times daily.     promethazine 12.5 MG tablet  Commonly known as:  PHENERGAN  Take 1 tablet (12.5 mg total) by mouth every 6 (six) hours as needed for nausea or vomiting.     traMADol 50 MG tablet  Commonly known as:  ULTRAM  Take 1 tablet (50 mg total) by mouth every 8 (eight) hours as needed for moderate pain.  Brief H and P: For complete details please refer to admission H and P, but in brief Monique Lewis is a 81 y.o. female with history of diastolic CHF. Patient presents with worsening BLE edema, DOE, and orthopnea over the past week or so. This has been persistent. Patient admits that she drank "way to much fluid" last week and she associates her increased PO intake with increased swelling. She has also noted decreased UOP over the past couple of days as well.   Hospital Course:  Acute on chronic diastolic CHF Improving, patient was unsure of her dry weight, patient was placed on IV diuresis. She has negative balance of 4.0 L since admission. Weight improved from 214lbs to 203 lbs.  Patient had a recent echocardiogram done on 12/9 which had shown EF of 65-70%, grade 1 diastolic dysfunction At the time of discharge, patient was seen by her cardiologist, Dr. Algie Coffer, cleared for discharge,  increased her home dose of Lasix to 40 mg twice a day. Outpatient follow-up was scheduled on 08/31/14 at 11 AM Home health RN was also arranged for close follow-up  Hypertension with bradycardia Patient's heart rate has been in 50s, so far tolerating Coreg, monitor closely  Diabetes mellitus type 2 Hemoglobin A1c 7.0, continue glipizide  Nausea, diarrhea Resolved, stool studies were ordered however patient did not have any further diarrhea.  Day of Discharge BP 148/50 mmHg  Pulse 55  Temp(Src) 97.7 F (36.5 C) (Oral)  Resp 18  Ht 5' (1.524 m)  Wt 92.171 kg (203 lb 3.2 oz)  BMI 39.68 kg/m2  SpO2 96%  Physical Exam: General: Alert and awake oriented x3 not in any acute distress. CVS: S1-S2 clear, 2/6 systolic murmur Chest: clear to auscultation bilaterally, no wheezing rales or rhonchi Abdomen: soft nontender, nondistended, normal bowel sounds Extremities: no cyanosis, clubbing, trace edema bilaterally Neuro: Cranial nerves II-XII intact, no focal neurological deficits   The results of significant diagnostics from this hospitalization (including imaging, microbiology, ancillary and laboratory) are listed below for reference.    LAB RESULTS: Basic Metabolic Panel:  Recent Labs Lab 08/21/14 0312 08/22/14 0458  NA 138 138  K 4.6 4.0  CL 101 98  CO2 26 32  GLUCOSE 114* 99  BUN 19 19  CREATININE 0.98 0.98  CALCIUM 8.9 8.7   Liver Function Tests: No results for input(s): AST, ALT, ALKPHOS, BILITOT, PROT, ALBUMIN in the last 168 hours. No results for input(s): LIPASE, AMYLASE in the last 168 hours. No results for input(s): AMMONIA in the last 168 hours. CBC:  Recent Labs Lab 08/19/14 1652 08/21/14 0312  WBC 6.2 5.6  HGB 10.0* 9.8*  HCT 32.8* 31.8*  MCV 76.6* 75.7*  PLT 262 244   Cardiac Enzymes:  Recent Labs Lab 08/19/14 1652  TROPONINI <0.03   BNP: Invalid input(s): POCBNP CBG:  Recent Labs Lab 08/23/14 0602 08/23/14 1128  GLUCAP 111* 146*     Significant Diagnostic Studies:  Dg Chest 2 View  08/19/2014   CLINICAL DATA:  BILATERAL feet and leg swelling, shortness of breath, mid chest pain and dizziness since last week, history hypertension, diabetes, asthma, COPD, CHF  EXAM: CHEST  2 VIEW  COMPARISON:  07/06/2014  FINDINGS: Enlargement of cardiac silhouette with slight vascular congestion.  Large hiatal hernia.  Mediastinal contours otherwise stable.  After sclerotic calcification aorta.  No acute edema or segmental consolidation.  Emphysematous changes with minimal basilar atelectasis.  Cannot exclude small RIGHT subpulmonic pleural effusion.  No pneumothorax.  Levoconvex thoracolumbar  scoliosis.  IMPRESSION: Enlargement of cardiac silhouette with pulmonary vascular congestion.  Emphysematous changes with minimal bibasilar atelectasis and questionable small RIGHT pleural effusion.  No acute infiltrate.   Electronically Signed   By: Ulyses SouthwardMark  Boles M.D.   On: 08/19/2014 17:15   [place] Renal  08/19/2014   CLINICAL DATA:  Acute renal insufficiency.  EXAM: RENAL/URINARY TRACT ULTRASOUND COMPLETE  COMPARISON:  CT abdomen and pelvis 05/18/2012.  FINDINGS: Right Kidney:  Length: 9.6 cm. Echogenicity within normal limits. No mass or hydronephrosis visualized.  Left Kidney:  Length: 10.5 cm. Echogenicity within normal limits. No mass or hydronephrosis visualized.  Bladder:  Appears normal for degree of bladder distention.  IMPRESSION: Negative for hydronephrosis.  Negative exam.   Electronically Signed   By: Drusilla Kannerhomas  Dalessio M.D.   On: 08/19/2014 20:48    2D ECHO:   Disposition and Follow-up:     Discharge Instructions    (HEART FAILURE PATIENTS) Call MD:  Anytime you have any of the following symptoms: 1) 3 pound weight gain in 24 hours or 5 pounds in 1 week 2) shortness of breath, with or without a dry hacking cough 3) swelling in the hands, feet or stomach 4) if you have to sleep on extra pillows at night in order to breathe.    Complete by:  As  directed      Diet - low sodium heart healthy    Complete by:  As directed      Diet Carb Modified    Complete by:  As directed      Increase activity slowly    Complete by:  As directed             DISPOSITION: home   DISCHARGE FOLLOW-UP Follow-up Information    Follow up with [facility].   Why:  Registered Nurse Services to start within 24-48 hours of hospital discharge   Contact information:   [address] [place] [address] [phone]       Follow up with Ricki RodriguezKADAKIA,AJAY S, MD On 08/31/2014.   Specialty:  Cardiology   Why:  Tuesday @ 11 AM for hospital follow-up, obtain labs bmet    Contact information:   [address] [place] [address] [phone]       Follow up with Osei A Bonsu, DO. Schedule an appointment as soon as possible for a visit in 2 weeks.   Specialty:  Internal Medicine   Why:  for hospital follow-up   Contact information:   [facility] [place] [id number] [phone]        Time spent on Discharge: 35 mins  Signed:   Riyan Gavina M.D. [facility] 08/23/2014, 1:17 PM Pager: [phone]

## 2014-08-23 NOTE — Consult Note (Signed)
Referring Physician:Dr. Talmage Coin Bonsu, /Dr. Estill Cotta  Monique Lewis is an 81 y.o. female.                       Chief Complaint: Sortness of breath and leg swelling  HPI: Monique Lewis is a 81 y.o. female with history of diastolic CHF. Patient presented with worsening BLE edema, DOE, and orthopnea over the past week or so. Patient admited to drinking way to much fluid last week and she associated her increased PO intake with increased swelling. She has also noted decreased UOP over the past couple of days prior to admission as well. She responded to lasix and medical management.  Past Medical History  Diagnosis Date  . Hypertension   . COPD (chronic obstructive pulmonary disease)   . HTN (hypertension) 06/10/2012  . GERD (gastroesophageal reflux disease) 06/10/2012  . Depression 06/10/2012  . Hepatitis-C 06/10/2012    Hx of Hep C after knee surgery 2001 s/p treatment per patient.  . Gout 06/10/2012  . Asthma 06/10/2012  . CHF (congestive heart failure)   . Heart murmur   . Anginal pain   . Pneumonia ~ 2001  . Exertional dyspnea   . Iron deficiency anemia 06/10/2012  . History of blood transfusion     "several; first one I caught Hepatitis" (06/10/12)  . Headache(784.0)     "severe last 3 wk" (06/10/12)  . Arthritis     "all over my body" (06/10/12)  . Chronic lower back pain   . Recurrent UTI (urinary tract infection)     "get them maybe q 6 months" (06/10/12)  . Allergy   . Hyperpotassemia   . Family history of adverse reaction to anesthesia     " my sister had complications but I dont know what "      Past Surgical History  Procedure Laterality Date  . Cholecystectomy  2006  . Tonsillectomy and adenoidectomy  1940's  . Dilation and curettage of uterus    . Joint replacement      s/p 5 knee replacements  . Total knee arthroplasty  2001 - 2011    "5 total; have had them done on both knees" (06/10/12)    History reviewed. No pertinent family history. Social  History:  reports that she has never smoked. She has never used smokeless tobacco. She reports that she does not drink alcohol or use illicit drugs.  Allergies:  Allergies  Allergen Reactions  . Celebrex [Celecoxib] Itching  . Sulfa Antibiotics Itching  . Codeine Itching  . Shellfish Allergy Itching  . Iodine Rash  . Latex Rash    Medications Prior to Admission  Medication Sig Dispense Refill  . albuterol (PROVENTIL HFA;VENTOLIN HFA) 108 (90 BASE) MCG/ACT inhaler Inhale 2 puffs into the lungs every 4 (four) hours as needed. For wheezing    . budesonide-formoterol (SYMBICORT) 160-4.5 MCG/ACT inhaler Inhale 2 puffs into the lungs 2 (two) times daily.    . carvedilol (COREG) 3.125 MG tablet Take 3.125 mg by mouth daily.    . clobetasol cream (TEMOVATE) 6.28 % Apply 1 application topically 2 (two) times daily.    . cloNIDine (CATAPRES) 0.1 MG tablet     . desonide (DESOWEN) 0.05 % cream     . doxepin (SINEQUAN) 50 MG capsule Take 50 mg by mouth at bedtime.    Marland Kitchen escitalopram (LEXAPRO) 10 MG tablet Take 10 mg by mouth daily.    Marland Kitchen esomeprazole (NEXIUM) 40 MG  capsule Take 40 mg by mouth daily before breakfast.    . fluticasone (FLONASE) 50 MCG/ACT nasal spray Place 2 sprays into the nose daily.    Marland Kitchen glipiZIDE (GLUCOTROL XL) 5 MG 24 hr tablet Take 1 tablet by mouth daily.    . hydrOXYzine (ATARAX/VISTARIL) 25 MG tablet Take 25 mg by mouth every 4 (four) hours as needed. For itching    . ketoconazole (NIZORAL) 2 % cream     . loratadine (CLARITIN) 10 MG tablet Take 10 mg by mouth daily.    . potassium chloride SA (K-DUR,KLOR-CON) 20 MEQ tablet Take 1 tablet (20 mEq total) by mouth 2 (two) times daily. 60 tablet 1  . [DISCONTINUED] furosemide (LASIX) 40 MG tablet Take 1 tablet (40 mg total) by mouth daily. 30 tablet 0  . [DISCONTINUED] traMADol (ULTRAM) 50 MG tablet       Results for orders placed or performed during the hospital encounter of 08/19/14 (from the past 48 hour(s))  Glucose,  capillary     Status: Abnormal   Collection Time: 08/21/14 11:54 AM  Result Value Ref Range   Glucose-Capillary 133 (H) 70 - 99 mg/dL   Comment 1 Documented in Chart    Comment 2 Notify RN   Lipid panel     Status: None   Collection Time: 08/21/14  2:50 PM  Result Value Ref Range   Cholesterol 130 0 - 200 mg/dL   Triglycerides 63 <150 mg/dL   HDL 50 >39 mg/dL   Total CHOL/HDL Ratio 2.6 RATIO   VLDL 13 0 - 40 mg/dL   LDL Cholesterol 67 0 - 99 mg/dL    Comment:        Total Cholesterol/HDL:CHD Risk Coronary Heart Disease Risk Table                     Men   Women  1/2 Average Risk   3.4   3.3  Average Risk       5.0   4.4  2 X Average Risk   9.6   7.1  3 X Average Risk  23.4   11.0        Use the calculated Patient Ratio above and the CHD Risk Table to determine the patient's CHD Risk.        ATP III CLASSIFICATION (LDL):  <100     mg/dL   Optimal  100-129  mg/dL   Near or Above                    Optimal  130-159  mg/dL   Borderline  160-189  mg/dL   High  >190     mg/dL   Very High   Glucose, capillary     Status: Abnormal   Collection Time: 08/21/14  4:11 PM  Result Value Ref Range   Glucose-Capillary 192 (H) 70 - 99 mg/dL   Comment 1 Documented in Chart    Comment 2 Notify RN   Glucose, capillary     Status: None   Collection Time: 08/21/14  9:03 PM  Result Value Ref Range   Glucose-Capillary 91 70 - 99 mg/dL  Basic metabolic panel     Status: Abnormal   Collection Time: 08/22/14  4:58 AM  Result Value Ref Range   Sodium 138 135 - 145 mmol/L   Potassium 4.0 3.5 - 5.1 mmol/L   Chloride 98 96 - 112 mmol/L   CO2 32 19 - 32 mmol/L  Glucose, Bld 99 70 - 99 mg/dL   BUN 19 6 - 23 mg/dL   Creatinine, Ser 0.98 0.50 - 1.10 mg/dL   Calcium 8.7 8.4 - 10.5 mg/dL   GFR calc non Af Amer 53 (L) >90 mL/min   GFR calc Af Amer 62 (L) >90 mL/min    Comment: (NOTE) The eGFR has been calculated using the CKD EPI equation. This calculation has not been validated in all  clinical situations. eGFR's persistently <90 mL/min signify possible Chronic Kidney Disease.    Anion gap 8 5 - 15  Glucose, capillary     Status: Abnormal   Collection Time: 08/22/14  5:44 AM  Result Value Ref Range   Glucose-Capillary 111 (H) 70 - 99 mg/dL  Glucose, capillary     Status: Abnormal   Collection Time: 08/22/14 12:08 PM  Result Value Ref Range   Glucose-Capillary 157 (H) 70 - 99 mg/dL   Comment 1 Documented in Chart    Comment 2 Notify RN   Glucose, capillary     Status: Abnormal   Collection Time: 08/22/14  4:39 PM  Result Value Ref Range   Glucose-Capillary 140 (H) 70 - 99 mg/dL   Comment 1 Documented in Chart    Comment 2 Notify RN   Glucose, capillary     Status: Abnormal   Collection Time: 08/22/14  9:22 PM  Result Value Ref Range   Glucose-Capillary 144 (H) 70 - 99 mg/dL  Glucose, capillary     Status: Abnormal   Collection Time: 08/23/14  6:02 AM  Result Value Ref Range   Glucose-Capillary 111 (H) 70 - 99 mg/dL   No results found.  Review Of Systems + weight gain/loss, Wears glasses, No hearing loss, + chest pain, + dyspnea, + edema, + hepatitis C, negative stroke, kidney stone or psych admission. No hemoptysis or gastrointestinal or genitourinary bleed.  Blood pressure 148/50, pulse 55, temperature 97.7 F (36.5 C), temperature source Oral, resp. rate 18, height 5' (1.524 m), weight 92.171 kg (203 lb 3.2 oz), SpO2 96 %. Physical Exam: General: Averagely developed and well nourished. No respiratory distress.  HEENT: [place]/AT, brown eyes, conj-pale, sclera-non-icteric. Tongue pale, mid-line.  Neck -No JVD.  Lungs: Clear, bilaterally.  Heart: Normal S1 and S2. II/VI systolic murmur.  Abdomen: Soft, distended, non-tender.  Ext-Trace edema of lower ext. +ve varicose veins.  CNS: Cranial nerves grossly intact. Moves all 4 ext. Skin:-Warm and dry  Assessment/Plan Acute on chronic diastolic heart failure  Bilateral edema of lower extremity -  improving Obesity Hypertension Chronic anemia without iron deficiency COPD Gout  Agree with discharge plan. F/U in 2 weeks.  Birdie Riddle, MD  08/23/2014, 11:22 AM

## 2015-03-01 ENCOUNTER — Other Ambulatory Visit (HOSPITAL_COMMUNITY): Payer: Self-pay | Admitting: Nurse Practitioner

## 2015-03-01 DIAGNOSIS — R748 Abnormal levels of other serum enzymes: Secondary | ICD-10-CM

## 2015-03-01 DIAGNOSIS — B182 Chronic viral hepatitis C: Secondary | ICD-10-CM

## 2015-03-30 NOTE — Patient Outreach (Signed)
[facility]) Care Management  03/30/2015  [name] April 29, 1933 [id number]   Referral from High Risk List, assigned Rowe Pavy, RN to outreach.  Thanks, Corrie Mckusick. Sharlee Blew [facility] Care Management [facility] CM Assistant Phone: [phone] Fax: [phone]

## 2015-04-05 ENCOUNTER — Other Ambulatory Visit: Payer: Self-pay

## 2015-04-05 ENCOUNTER — Ambulatory Visit (HOSPITAL_COMMUNITY): Payer: Medicare Other

## 2015-04-05 NOTE — Patient Outreach (Signed)
COPD screening: High Risk referral:  Placed call to patient. Patient identified herself.  Reviewed [facility] program. Patient reports that she does not see Dr. Cloyd Stagers Bonsu anymore. Reports her new Primary MD is Dr. Virl Son in [facility]  Reviewed [facility] directory while on the phone with patient. New primary care MD is not in [facility] network. Informed patient.   Plan: Will close case as patient is not eligible for [facility] services due to no [facility] provider.  Rowe Pavy, RN, BSN, CEN [facility] [phone]

## 2015-04-06 NOTE — Patient Outreach (Signed)
[facility]) Care Management  04/06/2015  Monique Lewis 01-Aug-1932 [id number]   Notification from Rowe Pavy, RN to close case as patient is no longer eligible for [facility] Care Management services.  Thanks, Corrie Mckusick. Sharlee Blew [facility] Care Management [facility] CM Assistant Phone: [phone] Fax: [phone]

## 2015-05-12 ENCOUNTER — Ambulatory Visit (HOSPITAL_COMMUNITY)
Admission: RE | Admit: 2015-05-12 | Discharge: 2015-05-12 | Disposition: A | Discharge status: Home / Self Care | Payer: Medicare Other | Source: Ambulatory Visit | Attending: Nurse Practitioner | Admitting: Nurse Practitioner

## 2015-05-12 DIAGNOSIS — K7460 Unspecified cirrhosis of liver: Secondary | ICD-10-CM | POA: Insufficient documentation

## 2015-05-12 DIAGNOSIS — B182 Chronic viral hepatitis C: Secondary | ICD-10-CM | POA: Diagnosis not present

## 2015-05-12 DIAGNOSIS — R748 Abnormal levels of other serum enzymes: Secondary | ICD-10-CM | POA: Insufficient documentation

## 2017-10-08 ENCOUNTER — Inpatient Hospital Stay (HOSPITAL_BASED_OUTPATIENT_CLINIC_OR_DEPARTMENT_OTHER)
Admission: EM | Admit: 2017-10-08 | Discharge: 2017-10-18 | DRG: 292 | Disposition: A | Discharge status: Home Health | Payer: Medicare Other | Attending: Family Medicine | Admitting: Family Medicine

## 2017-10-08 ENCOUNTER — Emergency Department (HOSPITAL_BASED_OUTPATIENT_CLINIC_OR_DEPARTMENT_OTHER): Payer: Medicare Other

## 2017-10-08 ENCOUNTER — Encounter (HOSPITAL_BASED_OUTPATIENT_CLINIC_OR_DEPARTMENT_OTHER): Payer: Self-pay | Admitting: Emergency Medicine

## 2017-10-08 ENCOUNTER — Other Ambulatory Visit: Payer: Self-pay

## 2017-10-08 DIAGNOSIS — E119 Type 2 diabetes mellitus without complications: Secondary | ICD-10-CM | POA: Diagnosis present

## 2017-10-08 DIAGNOSIS — E118 Type 2 diabetes mellitus with unspecified complications: Secondary | ICD-10-CM | POA: Diagnosis not present

## 2017-10-08 DIAGNOSIS — R609 Edema, unspecified: Secondary | ICD-10-CM | POA: Diagnosis not present

## 2017-10-08 DIAGNOSIS — Z7951 Long term (current) use of inhaled steroids: Secondary | ICD-10-CM | POA: Diagnosis not present

## 2017-10-08 DIAGNOSIS — D509 Iron deficiency anemia, unspecified: Secondary | ICD-10-CM | POA: Diagnosis present

## 2017-10-08 DIAGNOSIS — F319 Bipolar disorder, unspecified: Secondary | ICD-10-CM | POA: Diagnosis present

## 2017-10-08 DIAGNOSIS — R001 Bradycardia, unspecified: Secondary | ICD-10-CM | POA: Diagnosis not present

## 2017-10-08 DIAGNOSIS — Z91013 Allergy to seafood: Secondary | ICD-10-CM

## 2017-10-08 DIAGNOSIS — I110 Hypertensive heart disease with heart failure: Principal | ICD-10-CM | POA: Diagnosis present

## 2017-10-08 DIAGNOSIS — Z91041 Radiographic dye allergy status: Secondary | ICD-10-CM

## 2017-10-08 DIAGNOSIS — I160 Hypertensive urgency: Secondary | ICD-10-CM | POA: Diagnosis not present

## 2017-10-08 DIAGNOSIS — R748 Abnormal levels of other serum enzymes: Secondary | ICD-10-CM | POA: Diagnosis not present

## 2017-10-08 DIAGNOSIS — Z91040 Latex allergy status: Secondary | ICD-10-CM

## 2017-10-08 DIAGNOSIS — D649 Anemia, unspecified: Secondary | ICD-10-CM | POA: Diagnosis present

## 2017-10-08 DIAGNOSIS — M109 Gout, unspecified: Secondary | ICD-10-CM | POA: Diagnosis present

## 2017-10-08 DIAGNOSIS — I5033 Acute on chronic diastolic (congestive) heart failure: Secondary | ICD-10-CM | POA: Diagnosis present

## 2017-10-08 DIAGNOSIS — Z888 Allergy status to other drugs, medicaments and biological substances status: Secondary | ICD-10-CM | POA: Diagnosis not present

## 2017-10-08 DIAGNOSIS — K219 Gastro-esophageal reflux disease without esophagitis: Secondary | ICD-10-CM | POA: Diagnosis present

## 2017-10-08 DIAGNOSIS — J449 Chronic obstructive pulmonary disease, unspecified: Secondary | ICD-10-CM | POA: Diagnosis present

## 2017-10-08 DIAGNOSIS — Z886 Allergy status to analgesic agent status: Secondary | ICD-10-CM

## 2017-10-08 DIAGNOSIS — Z7984 Long term (current) use of oral hypoglycemic drugs: Secondary | ICD-10-CM | POA: Diagnosis not present

## 2017-10-08 DIAGNOSIS — T502X5A Adverse effect of carbonic-anhydrase inhibitors, benzothiadiazides and other diuretics, initial encounter: Secondary | ICD-10-CM | POA: Diagnosis not present

## 2017-10-08 DIAGNOSIS — I509 Heart failure, unspecified: Secondary | ICD-10-CM

## 2017-10-08 DIAGNOSIS — E669 Obesity, unspecified: Secondary | ICD-10-CM | POA: Diagnosis present

## 2017-10-08 DIAGNOSIS — R7989 Other specified abnormal findings of blood chemistry: Secondary | ICD-10-CM

## 2017-10-08 DIAGNOSIS — Z96653 Presence of artificial knee joint, bilateral: Secondary | ICD-10-CM | POA: Diagnosis present

## 2017-10-08 DIAGNOSIS — I472 Ventricular tachycardia: Secondary | ICD-10-CM | POA: Diagnosis not present

## 2017-10-08 DIAGNOSIS — Z6837 Body mass index (BMI) 37.0-37.9, adult: Secondary | ICD-10-CM | POA: Diagnosis not present

## 2017-10-08 DIAGNOSIS — E876 Hypokalemia: Secondary | ICD-10-CM | POA: Diagnosis not present

## 2017-10-08 DIAGNOSIS — R778 Other specified abnormalities of plasma proteins: Secondary | ICD-10-CM

## 2017-10-08 DIAGNOSIS — Z9049 Acquired absence of other specified parts of digestive tract: Secondary | ICD-10-CM

## 2017-10-08 DIAGNOSIS — Z885 Allergy status to narcotic agent status: Secondary | ICD-10-CM

## 2017-10-08 DIAGNOSIS — R42 Dizziness and giddiness: Secondary | ICD-10-CM | POA: Diagnosis not present

## 2017-10-08 LAB — BASIC METABOLIC PANEL
Anion gap: 10 (ref 5–15)
BUN: 24 mg/dL — ABNORMAL HIGH (ref 6–20)
CALCIUM: 8.6 mg/dL — AB (ref 8.9–10.3)
CO2: 30 mmol/L (ref 22–32)
Chloride: 98 mmol/L — ABNORMAL LOW (ref 101–111)
Creatinine, Ser: 1.07 mg/dL — ABNORMAL HIGH (ref 0.44–1.00)
GFR calc Af Amer: 54 mL/min — ABNORMAL LOW (ref >60)
GFR calc non Af Amer: 47 mL/min — ABNORMAL LOW (ref >60)
Glucose, Bld: 229 mg/dL — ABNORMAL HIGH (ref 65–99)
Potassium: 3.6 mmol/L (ref 3.5–5.1)
Sodium: 138 mmol/L (ref 135–145)

## 2017-10-08 LAB — CBC WITH DIFFERENTIAL/PLATELET
BASOS ABS: 0 10*3/uL (ref 0.0–0.1)
Basophils Relative: 0 %
Eosinophils Absolute: 0.2 10*3/uL (ref 0.0–0.7)
Eosinophils Relative: 2 %
HEMATOCRIT: 25.8 % — AB (ref 36.0–46.0)
Hemoglobin: 7.9 g/dL — ABNORMAL LOW (ref 12.0–15.0)
Lymphocytes Relative: 33 %
Lymphs Abs: 3.4 10*3/uL (ref 0.7–4.0)
MCH: 25 pg — ABNORMAL LOW (ref 26.0–34.0)
MCHC: 30.6 g/dL (ref 30.0–36.0)
MCV: 81.6 fL (ref 78.0–100.0)
MONO ABS: 1.3 10*3/uL — AB (ref 0.1–1.0)
Monocytes Relative: 12 %
NEUTROS ABS: 5.4 10*3/uL (ref 1.7–7.7)
NEUTROS PCT: 53 %
Platelets: 234 10*3/uL (ref 150–400)
RBC: 3.16 MIL/uL — ABNORMAL LOW (ref 3.87–5.11)
RDW: 18.3 % — ABNORMAL HIGH (ref 11.5–15.5)
WBC: 10.3 10*3/uL (ref 4.0–10.5)

## 2017-10-08 LAB — TROPONIN I: Troponin I: 0.04 ng/mL (ref <0.03)

## 2017-10-08 LAB — OCCULT BLOOD X 1 CARD TO LAB, STOOL: Fecal Occult Bld: NEGATIVE

## 2017-10-08 LAB — BRAIN NATRIURETIC PEPTIDE: B NATRIURETIC PEPTIDE 5: 463.8 pg/mL — AB (ref 0.0–100.0)

## 2017-10-08 IMAGING — DX DG CHEST 2V
2 series · 2 of 2 positions shown · non-contrast
Comparison: [DATE]

CLINICAL DATA: Bilateral leg swelling and shortness of Breath

EXAM:
CHEST - 2 VIEW

[chest pa]
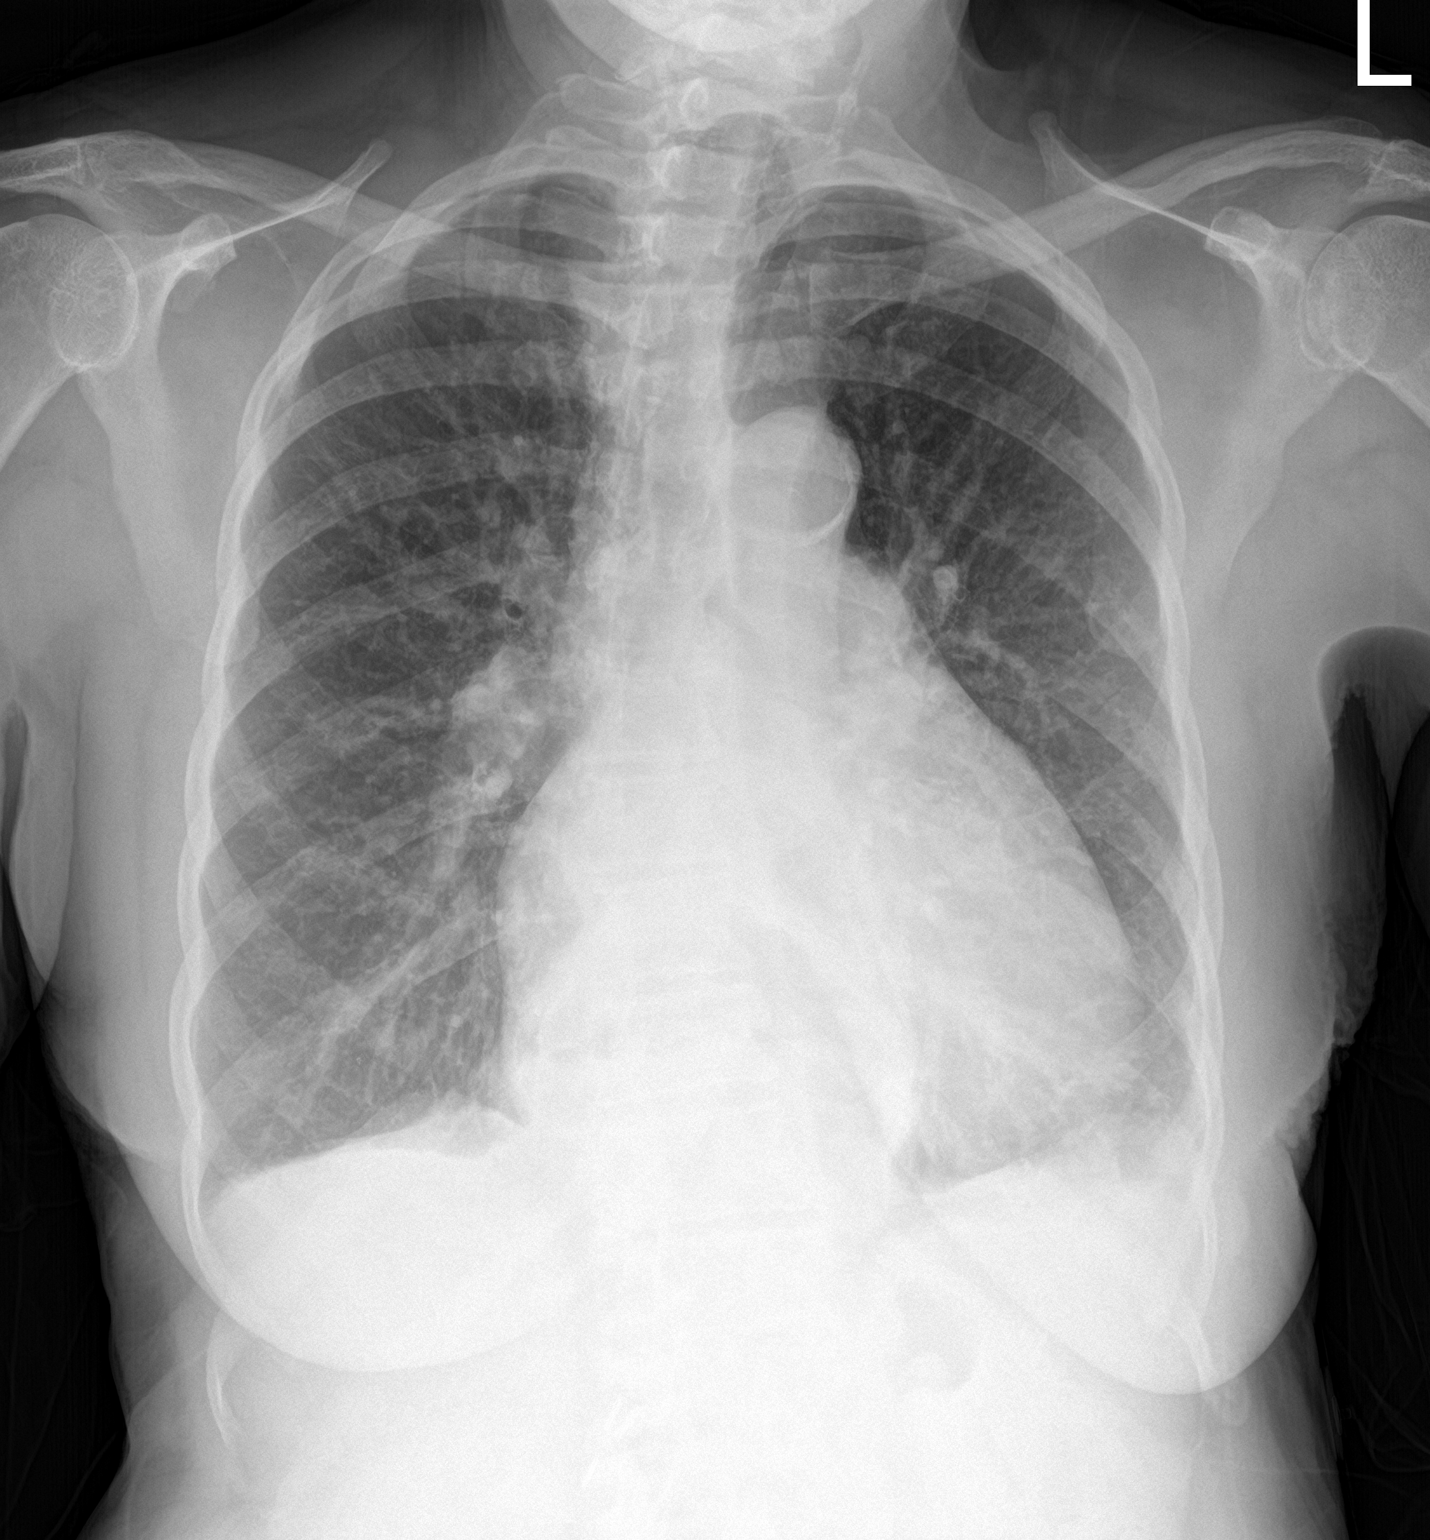

[chest lat]
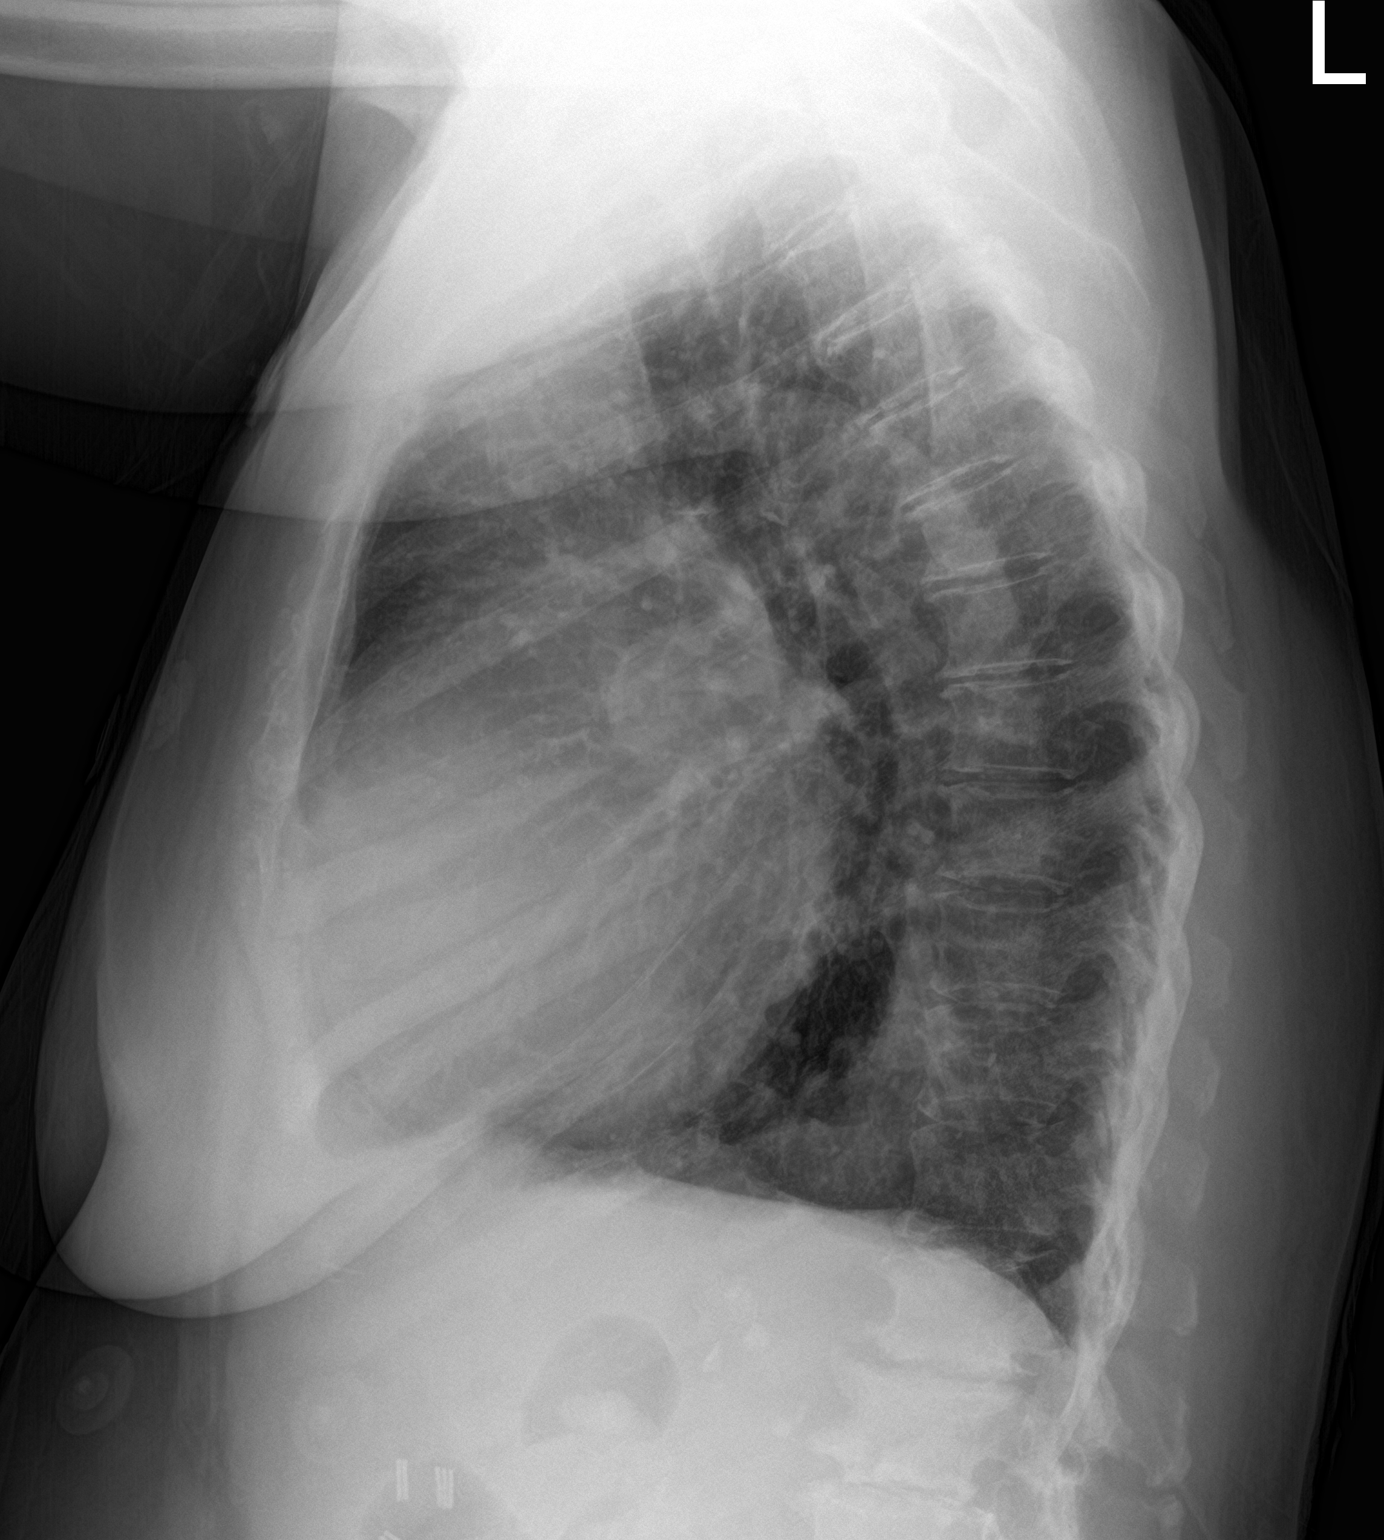

[2 of 2 positions shown; findings below may reference images not displayed]

FINDINGS: Cardiac shadow is mildly enlarged but stable. Aortic calcifications
and hiatal hernia are again noted and stable. Mild vascular
congestion is noted without interstitial edema. No sizable effusion
is noted. Mild hyperinflation of the lungs is noted.
IMPRESSION: Mild vascular congestion.

COPD.

Hiatal hernia.

## 2017-10-08 MED ORDER — FUROSEMIDE 10 MG/ML IJ SOLN
80.0000 mg | Freq: Once | INTRAMUSCULAR | Status: AC
  Administered 2017-10-08: 80 mg via INTRAVENOUS
  Filled 2017-10-08: qty 8

## 2017-10-08 NOTE — ED Notes (Addendum)
Contacted [facility] ([facility]) -- there are no telemetry beds available and they are holding 5 in their facility at this time

## 2017-10-08 NOTE — ED Triage Notes (Signed)
Bilateral leg swelling and SOB x 2 weeks. Seen by PCP Friday and given lasix. Pt states she is not feeling any better.

## 2017-10-08 NOTE — ED Notes (Signed)
Date and time results received: 10/08/17 2222 (use smartphrase ".now" to insert current time)  Test: troponin Critical Value: 0.04  Name of Provider Notified: Dr. Fredderick PhenixBelfi  Orders Received? Or Actions Taken?: no new orders

## 2017-10-08 NOTE — ED Provider Notes (Signed)
[facility] EMERGENCY DEPARTMENT Provider Note   CSN: [id number] Arrival date & time: 10/08/17  1855     History   Chief Complaint Chief Complaint  Patient presents with  . Shortness of Breath  . Leg Swelling    HPI Monique Lewis is a 84 y.o. female.  Patient is a 84 year old female with a history of asthma, CHF, hypertension, hepatitis, prior anemia with prior blood transfusion who presents with shortness of breath and leg swelling.  She reports a 2-week history of worsening swelling to her lower legs.  She states that they are markedly more swollen than they normally are.  She also reports a 4-5-day history of worsening shortness of breath.  She denies any cough or chest congestion.  No fevers.  She describes some tightness across her chest which is fairly constant.  She denies abdominal pain.  No vomiting or diarrhea.  She [facility] about 4 days ago and was given an injection of Lasix.  She states she has not had any improvement in her symptoms.  She normally takes torsemide at home.  She has not changed her dose slightly.  Her current medication list includes Lasix and not torsemide but she tells me she does not take Lasix at home and is taking torsemide.      Past Medical History:  Diagnosis Date  . Allergy   . Anginal pain (HCC)   . Arthritis    "all over my body" (06/10/12)  . Asthma 06/10/2012  . CHF (congestive heart failure) (HCC)   . Chronic lower back pain   . COPD (chronic obstructive pulmonary disease) (HCC)   . Depression 06/10/2012  . Exertional dyspnea   . Family history of adverse reaction to anesthesia    " my sister had complications but I dont know what "  . GERD (gastroesophageal reflux disease) 06/10/2012  . Gout 06/10/2012  . Headache(784.0)    "severe last 3 wk" (06/10/12)  . Heart murmur   . Hepatitis-C 06/10/2012   Hx of Hep C after knee surgery 2001 s/p treatment per patient.  . History of blood  transfusion    "several; first one I caught Hepatitis" (06/10/12)  . HTN (hypertension) 06/10/2012  . Hyperpotassemia   . Hypertension   . Iron deficiency anemia 06/10/2012  . Pneumonia ~ 2001  . Recurrent UTI (urinary tract infection)    "get them maybe q 6 months" (06/10/12)    Patient Active Problem List   Diagnosis Date Noted  . Acute on chronic diastolic CHF (congestive heart failure) (HCC) 10/08/2017  . Nausea without vomiting   . Diarrhea   . Diabetes type 2, controlled (HCC)   . Essential hypertension   . Pulmonary hypertension (HCC)   . Lower extremity edema 08/19/2014  . Acute on chronic diastolic congestive heart failure (HCC) 08/19/2014  . DM2 (diabetes mellitus, type 2) (HCC) 08/19/2014  . Bilateral leg edema 07/06/2014  . Chest pain 06/10/2012  . HTN (hypertension) 06/10/2012  . Anemia 06/10/2012  . GERD (gastroesophageal reflux disease) 06/10/2012  . Depression 06/10/2012  . Hepatitis-C 06/10/2012  . COPD (chronic obstructive pulmonary disease) (HCC) 06/10/2012  . Gout 06/10/2012  . Asthma 06/10/2012  . UTI (lower urinary tract infection) 06/10/2012  . Bradycardia 06/10/2012    Past Surgical History:  Procedure Laterality Date  . CHOLECYSTECTOMY  2006  . DILATION AND CURETTAGE OF UTERUS    . JOINT REPLACEMENT     s/p 5 knee  replacements  . TONSILLECTOMY AND ADENOIDECTOMY  1940's  . TOTAL KNEE ARTHROPLASTY  2001 - 2011   "5 total; have had them done on both knees" (06/10/12)    OB History    No data available       Home Medications    Prior to Admission medications   Medication Sig Start Date End Date Taking? Authorizing Provider  albuterol (PROVENTIL HFA;VENTOLIN HFA) 108 (90 BASE) MCG/ACT inhaler Inhale 2 puffs into the lungs every 4 (four) hours as needed. For wheezing    [provider]  budesonide-formoterol (SYMBICORT) 160-4.5 MCG/ACT inhaler Inhale 2 puffs into the lungs 2 (two) times daily.    [provider]    carvedilol (COREG) 3.125 MG tablet Take 3.125 mg by mouth daily.    [provider]  clobetasol cream (TEMOVATE) 0.05 % Apply 1 application topically 2 (two) times daily. To right hip 08/23/14   Rai, Ripudeep K, MD  cloNIDine (CATAPRES) 0.1 MG tablet Take 1 tablet (0.1 mg total) by mouth daily. 08/23/14   Rai, Delene Ruffini, MD  desonide (DESOWEN) 0.05 % cream  10/23/12   [provider]  doxepin (SINEQUAN) 50 MG capsule Take 50 mg by mouth at bedtime.    [provider]  escitalopram (LEXAPRO) 10 MG tablet Take 10 mg by mouth daily.    [provider]  esomeprazole (NEXIUM) 40 MG capsule Take 40 mg by mouth daily before breakfast.    [provider]  fluticasone (FLONASE) 50 MCG/ACT nasal spray Place 2 sprays into the nose daily.    [provider]  furosemide (LASIX) 40 MG tablet Take 1 tablet (40 mg total) by mouth 2 (two) times daily. 08/23/14   Rai, Ripudeep K, MD  glipiZIDE (GLUCOTROL XL) 5 MG 24 hr tablet Take 1 tablet by mouth daily. 07/01/14   [provider]  hydrOXYzine (ATARAX/VISTARIL) 25 MG tablet Take 25 mg by mouth every 4 (four) hours as needed. For itching    [provider]  ketoconazole (NIZORAL) 2 % cream  09/12/12   [provider]  loratadine (CLARITIN) 10 MG tablet Take 10 mg by mouth daily.    [provider]  potassium chloride SA (K-DUR,KLOR-CON) 20 MEQ tablet Take 1 tablet (20 mEq total) by mouth 2 (two) times daily. 09/05/12   Orpah Cobb, MD  promethazine (PHENERGAN) 12.5 MG tablet Take 1 tablet (12.5 mg total) by mouth every 6 (six) hours as needed for nausea or vomiting. 08/23/14   Rai, Delene Ruffini, MD  traMADol (ULTRAM) 50 MG tablet Take 1 tablet (50 mg total) by mouth every 8 (eight) hours as needed for moderate pain. 08/23/14   Cathren Harsh, MD    Family History No family history on file.  Social History Social History   Tobacco Use  . Smoking status: Never Smoker  .  Smokeless tobacco: Never Used  Substance Use Topics  . Alcohol use: No  . Drug use: No     Allergies   Celebrex [celecoxib]; Sulfa antibiotics; Codeine; Shellfish allergy; Iodine; and Latex   Review of Systems Review of Systems  Constitutional: Negative for chills, diaphoresis, fatigue and fever.  HENT: Negative for congestion, rhinorrhea and sneezing.   Eyes: Negative.   Respiratory: Positive for shortness of breath. Negative for cough and chest tightness.   Cardiovascular: Positive for chest pain and leg swelling.  Gastrointestinal: Negative for abdominal pain, blood in stool, diarrhea, nausea and vomiting.  Genitourinary: Negative for difficulty urinating, flank pain,  frequency and hematuria.  Musculoskeletal: Negative for arthralgias and back pain.  Skin: Negative for rash.  Neurological: Negative for dizziness, speech difficulty, weakness, numbness and headaches.     Physical Exam Updated Vital Signs BP (!) 174/70   Pulse (!) 58   Temp 98.9 F (37.2 C) (Oral)   Resp 16   Ht 5' (1.524 m)   Wt 87.5 kg (193 lb)   SpO2 96%   BMI 37.69 kg/m   Physical Exam  Constitutional: She is oriented to person, place, and time. She appears well-developed and well-nourished.  HENT:  Head: Normocephalic and atraumatic.  Eyes: Pupils are equal, round, and reactive to light.  Neck: Normal range of motion. Neck supple.  Cardiovascular: Normal rate, regular rhythm and normal heart sounds.  Pulmonary/Chest: Effort normal. No respiratory distress. She has no wheezes. She has rales. She exhibits no tenderness.  Abdominal: Soft. Bowel sounds are normal. There is no tenderness. There is no rebound and no guarding.  No gross blood on rectal exam  Musculoskeletal: Normal range of motion.       Right lower leg: She exhibits edema.       Left lower leg: She exhibits edema.  Extensive edema to both lower extremities up to the knees.  There is mild erythema but no warmth.  Lymphadenopathy:     She has no cervical adenopathy.  Neurological: She is alert and oriented to person, place, and time.  Skin: Skin is warm and dry. No rash noted.  Psychiatric: She has a normal mood and affect.     ED Treatments / Results  Labs (all labs ordered are listed, but only abnormal results are displayed) Labs Reviewed  BRAIN NATRIURETIC PEPTIDE - Abnormal; Notable for the following components:      Result Value   B Natriuretic Peptide 463.8 (*)    All other components within normal limits  CBC WITH DIFFERENTIAL/PLATELET - Abnormal; Notable for the following components:   RBC 3.16 (*)    Hemoglobin 7.9 (*)    HCT 25.8 (*)    MCH 25.0 (*)    RDW 18.3 (*)    Monocytes Absolute 1.3 (*)    All other components within normal limits  BASIC METABOLIC PANEL - Abnormal; Notable for the following components:   Chloride 98 (*)    Glucose, Bld 229 (*)    BUN 24 (*)    Creatinine, Ser 1.07 (*)    Calcium 8.6 (*)    GFR calc non Af Amer 47 (*)    GFR calc Af Amer 54 (*)    All other components within normal limits  TROPONIN I - Abnormal; Notable for the following components:   Troponin I 0.04 (*)    All other components within normal limits  OCCULT BLOOD X 1 CARD TO LAB, STOOL    EKG  EKG Interpretation  Date/Time:  Tuesday October 08 2017 19:09:29 EDT Ventricular Rate:  61 PR Interval:  140 QRS Duration: 86 QT Interval:  458 QTC Calculation: 461 R Axis:   94 Text Interpretation:  Normal sinus rhythm Rightward axis Nonspecific ST and T wave abnormality Abnormal ECG minor ST depression laterally, seen on EKG from 2006 Reconfirmed by Rolan Bucco [phone]) on 10/08/2017 10:22:54 PM       Radiology Dg Chest 2 View  Result Date: 10/08/2017 CLINICAL DATA:  Bilateral leg swelling and shortness of Breath EXAM: CHEST - 2 VIEW COMPARISON:  07/01/2017 FINDINGS: Cardiac shadow is mildly enlarged but stable. Aortic calcifications and  hiatal hernia are again noted and stable. Mild vascular  congestion is noted without interstitial edema. No sizable effusion is noted. Mild hyperinflation of the lungs is noted. IMPRESSION: Mild vascular congestion. COPD. Hiatal hernia. Electronically Signed   By: Alcide Clever M.D.   On: 10/08/2017 19:48    Procedures Procedures (including critical care time)  Medications Ordered in ED Medications  furosemide (LASIX) injection 80 mg (80 mg Intravenous Given 10/08/17 2154)     Initial Impression / Assessment and Plan / ED Course  I have reviewed the triage vital signs and the nursing notes.  Pertinent labs & imaging results that were available during my care of the patient were reviewed by me and considered in my medical decision making (see chart for details).     Patient is a 84 year old female who presents with shortness of breath.  She has evidence of pulmonary edema and significant peripheral edema.  She was given a dose of Lasix.  Her EKG does not show any acute ischemic changes but her troponin is minimally elevated.  She denies any current chest pain.  She feels like her breathing has improved after the Lasix.  She does have significant anemia noted on her blood work.  Her last hemoglobin was around 10 but this was several years ago per chart review.  Her Hemoccult is negative.  She likely will need a blood transfusion if she remains symptomatic.  Patient initially requested to go to [facility] but they are not accepting any patients currently due to the bed situation.  I spoke with Dr. Antionette Char at [facility] who is accepted the patient for transfer.  Final Clinical Impressions(s) / ED Diagnoses   Final diagnoses:  Acute on chronic congestive heart failure, unspecified heart failure type (HCC)  Iron deficiency anemia, unspecified iron deficiency anemia type  Elevated troponin    ED Discharge Orders    None       Rolan Bucco, MD 10/08/17 2307

## 2017-10-08 NOTE — ED Notes (Signed)
ED Provider at bedside. 

## 2017-10-09 ENCOUNTER — Inpatient Hospital Stay (HOSPITAL_COMMUNITY)
Admit: 2017-10-09 | Discharge: 2017-10-09 | Disposition: A | Discharge status: Home / Self Care | Payer: Medicare Other | Attending: Internal Medicine | Admitting: Internal Medicine

## 2017-10-09 ENCOUNTER — Inpatient Hospital Stay (HOSPITAL_COMMUNITY): Payer: Medicare Other

## 2017-10-09 DIAGNOSIS — R609 Edema, unspecified: Secondary | ICD-10-CM

## 2017-10-09 DIAGNOSIS — I509 Heart failure, unspecified: Secondary | ICD-10-CM | POA: Insufficient documentation

## 2017-10-09 LAB — TROPONIN I
TROPONIN I: 0.03 ng/mL — AB (ref <0.03)
Troponin I: 0.03 ng/mL (ref <0.03)
Troponin I: 0.03 ng/mL (ref <0.03)

## 2017-10-09 LAB — CBC WITH DIFFERENTIAL/PLATELET
BASOS PCT: 0 %
Basophils Absolute: 0 10*3/uL (ref 0.0–0.1)
Eosinophils Absolute: 0.2 10*3/uL (ref 0.0–0.7)
Eosinophils Relative: 2 %
HEMATOCRIT: 24.6 % — AB (ref 36.0–46.0)
HEMOGLOBIN: 7.5 g/dL — AB (ref 12.0–15.0)
LYMPHS ABS: 3.2 10*3/uL (ref 0.7–4.0)
LYMPHS PCT: 36 %
MCH: 25.2 pg — ABNORMAL LOW (ref 26.0–34.0)
MCHC: 30.5 g/dL (ref 30.0–36.0)
MCV: 82.6 fL (ref 78.0–100.0)
MONOS PCT: 9 %
Monocytes Absolute: 0.8 10*3/uL (ref 0.1–1.0)
NEUTROS ABS: 4.7 10*3/uL (ref 1.7–7.7)
NEUTROS PCT: 53 %
Platelets: 226 10*3/uL (ref 150–400)
RBC: 2.98 MIL/uL — ABNORMAL LOW (ref 3.87–5.11)
RDW: 18.6 % — AB (ref 11.5–15.5)
WBC: 8.9 10*3/uL (ref 4.0–10.5)

## 2017-10-09 LAB — CBC
HCT: 27.9 % — ABNORMAL LOW (ref 36.0–46.0)
Hemoglobin: 8.4 g/dL — ABNORMAL LOW (ref 12.0–15.0)
MCH: 24.9 pg — ABNORMAL LOW (ref 26.0–34.0)
MCHC: 30.1 g/dL (ref 30.0–36.0)
MCV: 82.5 fL (ref 78.0–100.0)
PLATELETS: 215 10*3/uL (ref 150–400)
RBC: 3.38 MIL/uL — ABNORMAL LOW (ref 3.87–5.11)
RDW: 17.4 % — AB (ref 11.5–15.5)
WBC: 9.1 10*3/uL (ref 4.0–10.5)

## 2017-10-09 LAB — GLUCOSE, CAPILLARY
GLUCOSE-CAPILLARY: 127 mg/dL — AB (ref 65–99)
GLUCOSE-CAPILLARY: 134 mg/dL — AB (ref 65–99)
Glucose-Capillary: 129 mg/dL — ABNORMAL HIGH (ref 65–99)

## 2017-10-09 LAB — PREPARE RBC (CROSSMATCH)

## 2017-10-09 LAB — ABO/RH: ABO/RH(D): O POS

## 2017-10-09 MED ORDER — LOSARTAN POTASSIUM 50 MG PO TABS
50.0000 mg | ORAL_TABLET | Freq: Every day | ORAL | Status: DC
  Administered 2017-10-09 – 2017-10-14 (×6): 50 mg via ORAL
  Filled 2017-10-09 (×6): qty 1

## 2017-10-09 MED ORDER — ESCITALOPRAM OXALATE 10 MG PO TABS
10.0000 mg | ORAL_TABLET | Freq: Every day | ORAL | Status: DC
Start: 2017-10-09 — End: 2017-10-18
  Administered 2017-10-09 – 2017-10-18 (×10): 10 mg via ORAL
  Filled 2017-10-09 (×10): qty 1

## 2017-10-09 MED ORDER — SODIUM CHLORIDE 0.9% FLUSH: 3.0000 mL | INTRAVENOUS | Status: DC | PRN

## 2017-10-09 MED ORDER — POTASSIUM CHLORIDE CRYS ER 20 MEQ PO TBCR
20.0000 meq | EXTENDED_RELEASE_TABLET | Freq: Every day | ORAL | Status: DC
  Administered 2017-10-09: 20 meq via ORAL
  Filled 2017-10-09: qty 1

## 2017-10-09 MED ORDER — SODIUM CHLORIDE 0.9 % IV SOLN: 250.0000 mL | INTRAVENOUS | Status: DC | PRN

## 2017-10-09 MED ORDER — HYDRALAZINE HCL 20 MG/ML IJ SOLN: 10.0000 mg | INTRAMUSCULAR | Status: DC | PRN

## 2017-10-09 MED ORDER — FLUTICASONE PROPIONATE 50 MCG/ACT NA SUSP: 2.0000 | Freq: Every day | NASAL | Status: DC

## 2017-10-09 MED ORDER — ACETAMINOPHEN 325 MG PO TABS
650.0000 mg | ORAL_TABLET | ORAL | Status: DC | PRN
  Administered 2017-10-09 – 2017-10-17 (×9): 650 mg via ORAL
  Filled 2017-10-09 (×9): qty 2

## 2017-10-09 MED ORDER — SODIUM CHLORIDE 0.9% FLUSH
3.0000 mL | Freq: Two times a day (BID) | INTRAVENOUS | Status: DC
  Administered 2017-10-09 – 2017-10-18 (×20): 3 mL via INTRAVENOUS

## 2017-10-09 MED ORDER — ENOXAPARIN SODIUM 40 MG/0.4ML ~~LOC~~ SOLN
40.0000 mg | SUBCUTANEOUS | Status: DC
  Administered 2017-10-09 – 2017-10-18 (×10): 40 mg via SUBCUTANEOUS
  Filled 2017-10-09 (×10): qty 0.4

## 2017-10-09 MED ORDER — CARVEDILOL 3.125 MG PO TABS: 3.1250 mg | ORAL_TABLET | Freq: Every day | ORAL | Status: DC

## 2017-10-09 MED ORDER — HYDROXYZINE HCL 25 MG PO TABS
25.0000 mg | ORAL_TABLET | ORAL | Status: DC | PRN
  Administered 2017-10-09 – 2017-10-17 (×23): 25 mg via ORAL
  Filled 2017-10-09 (×23): qty 1

## 2017-10-09 MED ORDER — SODIUM CHLORIDE 0.9 % IV SOLN: Freq: Once | INTRAVENOUS | Status: DC

## 2017-10-09 MED ORDER — POTASSIUM CHLORIDE CRYS ER 20 MEQ PO TBCR: 20.0000 meq | EXTENDED_RELEASE_TABLET | Freq: Two times a day (BID) | ORAL | Status: DC

## 2017-10-09 MED ORDER — CLONIDINE HCL 0.1 MG PO TABS
0.1000 mg | ORAL_TABLET | Freq: Every day | ORAL | Status: DC
  Administered 2017-10-09 – 2017-10-10 (×2): 0.1 mg via ORAL
  Filled 2017-10-09 (×3): qty 1

## 2017-10-09 MED ORDER — DOXEPIN HCL 50 MG PO CAPS
50.0000 mg | ORAL_CAPSULE | Freq: Every day | ORAL | Status: DC
  Administered 2017-10-09 – 2017-10-17 (×9): 50 mg via ORAL
  Filled 2017-10-09 (×10): qty 1

## 2017-10-09 MED ORDER — PANTOPRAZOLE SODIUM 40 MG PO TBEC: 40.0000 mg | DELAYED_RELEASE_TABLET | Freq: Every day | ORAL | Status: DC

## 2017-10-09 MED ORDER — INSULIN ASPART 100 UNIT/ML ~~LOC~~ SOLN: 0.0000 [IU] | Freq: Every day | SUBCUTANEOUS | Status: DC

## 2017-10-09 MED ORDER — IPRATROPIUM-ALBUTEROL 0.5-2.5 (3) MG/3ML IN SOLN
3.0000 mL | RESPIRATORY_TRACT | Status: DC | PRN
  Administered 2017-10-10: 3 mL via RESPIRATORY_TRACT
  Filled 2017-10-09: qty 3

## 2017-10-09 MED ORDER — AMLODIPINE BESYLATE 5 MG PO TABS
5.0000 mg | ORAL_TABLET | Freq: Every day | ORAL | Status: DC
  Administered 2017-10-09 – 2017-10-14 (×6): 5 mg via ORAL
  Filled 2017-10-09 (×6): qty 1

## 2017-10-09 MED ORDER — ONDANSETRON HCL 4 MG/2ML IJ SOLN: 4.0000 mg | Freq: Four times a day (QID) | INTRAMUSCULAR | Status: DC | PRN

## 2017-10-09 MED ORDER — MOMETASONE FURO-FORMOTEROL FUM 200-5 MCG/ACT IN AERO
2.0000 | INHALATION_SPRAY | Freq: Two times a day (BID) | RESPIRATORY_TRACT | Status: DC
Start: 2017-10-09 — End: 2017-10-18
  Administered 2017-10-09 – 2017-10-18 (×19): 2 via RESPIRATORY_TRACT
  Filled 2017-10-09 (×2): qty 8.8

## 2017-10-09 MED ORDER — INSULIN ASPART 100 UNIT/ML ~~LOC~~ SOLN
0.0000 [IU] | Freq: Three times a day (TID) | SUBCUTANEOUS | Status: DC
  Administered 2017-10-10: 2 [IU] via SUBCUTANEOUS
  Administered 2017-10-11: 5 [IU] via SUBCUTANEOUS

## 2017-10-09 MED ORDER — FUROSEMIDE 10 MG/ML IJ SOLN
40.0000 mg | Freq: Two times a day (BID) | INTRAMUSCULAR | Status: DC
  Administered 2017-10-09 – 2017-10-10 (×4): 40 mg via INTRAVENOUS
  Filled 2017-10-09 (×4): qty 4

## 2017-10-09 NOTE — Progress Notes (Addendum)
Paged [facility] about patient's elevated BP tonight and asked if they would put back in the order for PRN hydralazine, as the order has been discontinued.   Clonidine 0.1 mg ordered by provider and to be started tonight.

## 2017-10-09 NOTE — Progress Notes (Signed)
Agree with HPI and a/p per Dr. Katrinka BlazingSmith  84 fem Known DM ty ii, gout, COPD, GERD, BIpolar, sinus brady, htn Known diastolic hf--LAst EF in our system EF 65-70% and diastolic HF Last admit 07/2014 with acute hf--dry weight appears ~ 203 to 205 [last OV]  Sees Dr. Desma MaximMcGukin of cardiology -last seens 08/28/17  Had ECHO EF60% 05/2017--Imdur added at that time  Comes in with 2 weeks of SOB and decompensated HF  Feels better now that she has received diurectics-was compliant with torsemide at home  O/e pleasant no ict nor pallor  mallampati 3-4 Slight jvd, no bruit 2/6 HSM across precordium abd sof tnt nd   A/p Will need 48 hours at least aggressive iv diuresis Check renal panel am Otherwise agree with plan  Monique KochJai Naama Sappington, MD [facility] (P) [phone]

## 2017-10-09 NOTE — Progress Notes (Addendum)
LE venous duplex prelim: no evidence of DVT. Patient had difficulty tolerating venous compressions. Farrel DemarkJill Eunice, RDMS, RVT

## 2017-10-09 NOTE — H&P (Addendum)
History and Physical    Monique Lewis AVW:[id number] DOB: [birth year] DOA: 10/08/2017  Referring MD/NP/PA: Dr. Gayla Medicus PCP: Verlon Au, MD  Patient coming from: [facility]  Chief Complaint: Shortness of breath  I have personally briefly reviewed patient's old medical records in [facility] Health Link   HPI: Monique Lewis is a 84 y.o. female with medical history significant of HTN, HLD, diastolic CHF last EF noted to be 60-65% in 05/2017, asthma/COPD, DM type II, and GERD; who presents with complaints of progressively worsening shortness of breath and leg swelling.  The last 2 weeks patient reports having worsening lower extremity swelling.  She chronically notes that her right leg swells more than her left leg due to previous surgical procedure.  However, in the last 4-5 days patient reported worsening shortness of breath.  Associated symptoms include complaints of orthopnea, intermittent wheezing, tightness across her chest, nausea, fatigue, and reports of dark stools approximately 3 weeks ago that have resolved.  Patient had been evaluated by her primary care provider at [facility] 4 days ago and given an injection of Lasix at that time, but noted no change in symptoms.  At home patient reports taking 20 mg of torsemide daily.  She is followed by Dr.  ED Course: Vital signs noted to be relatively stable with oxygen maintained on room air. Mild congestion noted on CXR, and BNP 163.8. Trop is slightly positive at 0.04, but patient without chest pain complaints. Hgb is 7.9, usually 8-10 on care everywhere, and  FOBT negative in ED. Treated with 80 mg IV Lasix.  Transfer to inpatient bed here at [facility].  Review of Systems  Constitutional: Positive for malaise/fatigue. Negative for chills, diaphoresis, fever and weight loss.  HENT: Negative for ear pain and nosebleeds.   Eyes: Negative for photophobia and pain.  Respiratory: Positive for shortness of breath and wheezing. Negative for cough.     Cardiovascular: Positive for chest pain, orthopnea and leg swelling.  Gastrointestinal: Positive for nausea. Negative for blood in stool and vomiting.  Genitourinary: Negative for dysuria and frequency.  Musculoskeletal: Positive for joint pain and myalgias. Negative for falls.  Skin: Positive for itching. Negative for rash.  Neurological: Negative for speech change and focal weakness.  Psychiatric/Behavioral: Negative for substance abuse. The patient has insomnia.     Past Medical History:  Diagnosis Date  . Allergy   . Anginal pain (HCC)   . Arthritis    "all over my body" (06/10/12)  . Asthma 06/10/2012  . CHF (congestive heart failure) (HCC)   . Chronic lower back pain   . COPD (chronic obstructive pulmonary disease) (HCC)   . Depression 06/10/2012  . Exertional dyspnea   . Family history of adverse reaction to anesthesia    " my sister had complications but I dont know what "  . GERD (gastroesophageal reflux disease) 06/10/2012  . Gout 06/10/2012  . Headache(784.0)    "severe last 3 wk" (06/10/12)  . Heart murmur   . Hepatitis-C 06/10/2012   Hx of Hep C after knee surgery 2001 s/p treatment per patient.  . History of blood transfusion    "several; first one I caught Hepatitis" (06/10/12)  . HTN (hypertension) 06/10/2012  . Hyperpotassemia   . Hypertension   . Iron deficiency anemia 06/10/2012  . Pneumonia ~ 2001  . Recurrent UTI (urinary tract infection)    "get them maybe q 6 months" (06/10/12)    Past Surgical History:  Procedure Laterality Date  .  CHOLECYSTECTOMY  2006  . DILATION AND CURETTAGE OF UTERUS    . JOINT REPLACEMENT     s/p 5 knee replacements  . TONSILLECTOMY AND ADENOIDECTOMY  1940's  . TOTAL KNEE ARTHROPLASTY  2001 - 2011   "5 total; have had them done on both knees" (06/10/12)     reports that  has never smoked. she has never used smokeless tobacco. She reports that she does not drink alcohol or use drugs.  Allergies  Allergen Reactions   . Celebrex [Celecoxib] Itching  . Sulfa Antibiotics Itching  . Codeine Itching  . Shellfish Allergy Itching  . Iodine Rash  . Latex Rash    No family history on file.  Prior to Admission medications   Medication Sig Start Date End Date Taking? Authorizing Provider  albuterol (PROVENTIL HFA;VENTOLIN HFA) 108 (90 BASE) MCG/ACT inhaler Inhale 2 puffs into the lungs every 4 (four) hours as needed. For wheezing    [provider]  budesonide-formoterol (SYMBICORT) 160-4.5 MCG/ACT inhaler Inhale 2 puffs into the lungs 2 (two) times daily.    [provider]  carvedilol (COREG) 3.125 MG tablet Take 3.125 mg by mouth daily.    [provider]  clobetasol cream (TEMOVATE) 0.05 % Apply 1 application topically 2 (two) times daily. To right hip 08/23/14   Rai, Ripudeep K, MD  cloNIDine (CATAPRES) 0.1 MG tablet Take 1 tablet (0.1 mg total) by mouth daily. 08/23/14   Rai, Delene Ruffiniipudeep K, MD  desonide (DESOWEN) 0.05 % cream  10/23/12   [provider]  doxepin (SINEQUAN) 50 MG capsule Take 50 mg by mouth at bedtime.    [provider]  escitalopram (LEXAPRO) 10 MG tablet Take 10 mg by mouth daily.    [provider]  esomeprazole (NEXIUM) 40 MG capsule Take 40 mg by mouth daily before breakfast.    [provider]  fluticasone (FLONASE) 50 MCG/ACT nasal spray Place 2 sprays into the nose daily.    [provider]  furosemide (LASIX) 40 MG tablet Take 1 tablet (40 mg total) by mouth 2 (two) times daily. 08/23/14   Rai, Ripudeep K, MD  glipiZIDE (GLUCOTROL XL) 5 MG 24 hr tablet Take 1 tablet by mouth daily. 07/01/14   [provider]  hydrOXYzine (ATARAX/VISTARIL) 25 MG tablet Take 25 mg by mouth every 4 (four) hours as needed. For itching    [provider]  ketoconazole (NIZORAL) 2 % cream  09/12/12   [provider]  loratadine (CLARITIN) 10 MG tablet Take 10 mg by mouth daily.    [provider]   potassium chloride SA (K-DUR,KLOR-CON) 20 MEQ tablet Take 1 tablet (20 mEq total) by mouth 2 (two) times daily. 09/05/12   Orpah CobbKadakia, Ajay, MD  promethazine (PHENERGAN) 12.5 MG tablet Take 1 tablet (12.5 mg total) by mouth every 6 (six) hours as needed for nausea or vomiting. 08/23/14   Rai, Delene Ruffiniipudeep K, MD  traMADol (ULTRAM) 50 MG tablet Take 1 tablet (50 mg total) by mouth every 8 (eight) hours as needed for moderate pain. 08/23/14   Cathren Harshai, Ripudeep K, MD    Physical Exam:  Constitutional: Elderly female in NAD, calm, comfortable Vitals:   10/08/17 2244 10/08/17 2245 10/08/17 2254 10/08/17 2340  BP: (!) 174/70 (!) 174/70    Pulse: 60 60 (!) 58 62  Resp: 16   16  Temp:      TempSrc:      SpO2: 94% 93% 96% 96%  Weight:  Height:       Eyes: PERRL, lids and conjunctivae normal ENMT: Mucous membranes are moist. Posterior pharynx clear of any exudate or lesions.  Neck: normal, supple, no masses, no thyromegaly no clear signs of JVD. Respiratory: clear to auscultation bilaterally, no wheezing, no crackles. Normal respiratory effort. No accessory muscle use.  Cardiovascular: Regular rate and rhythm, positive systolic murmur 2 out of 6. no rubs / gallops.  +2 pitting lower extremity edema noted worse on the right leg. 2+ pedal pulses. No carotid bruits.  Abdomen: no tenderness, no masses palpated. No hepatosplenomegaly. Bowel sounds positive.  Musculoskeletal: no clubbing / cyanosis. No joint deformity upper and lower extremities. Good ROM, no contractures. Normal muscle tone.  Skin: no rashes, lesions, ulcers. No induration Neurologic: CN 2-12 grossly intact. Sensation intact, DTR normal. Strength 5/5 in all 4.  Psychiatric: Normal judgment and insight. Alert and oriented x 3. Normal mood.     Labs on Admission: I have personally reviewed following labs and imaging studies  CBC: Recent Labs  Lab 10/08/17 1921  WBC 10.3  NEUTROABS 5.4  HGB 7.9*  HCT 25.8*  MCV 81.6  PLT 234   Basic  Metabolic Panel: Recent Labs  Lab 10/08/17 1921  NA 138  K 3.6  CL 98*  CO2 30  GLUCOSE 229*  BUN 24*  CREATININE 1.07*  CALCIUM 8.6*   GFR: Estimated Creatinine Clearance: 39.9 mL/min (A) (by C-G formula based on SCr of 1.07 mg/dL (H)). Liver Function Tests: No results for input(s): AST, ALT, ALKPHOS, BILITOT, PROT, ALBUMIN in the last 168 hours. No results for input(s): LIPASE, AMYLASE in the last 168 hours. No results for input(s): AMMONIA in the last 168 hours. Coagulation Profile: No results for input(s): INR, PROTIME in the last 168 hours. Cardiac Enzymes: Recent Labs  Lab 10/08/17 1921  TROPONINI 0.04*   BNP (last 3 results) No results for input(s): PROBNP in the last 8760 hours. HbA1C: No results for input(s): HGBA1C in the last 72 hours. CBG: No results for input(s): GLUCAP in the last 168 hours. Lipid Profile: No results for input(s): CHOL, HDL, LDLCALC, TRIG, CHOLHDL, LDLDIRECT in the last 72 hours. Thyroid Function Tests: No results for input(s): TSH, T4TOTAL, FREET4, T3FREE, THYROIDAB in the last 72 hours. Anemia Panel: No results for input(s): VITAMINB12, FOLATE, FERRITIN, TIBC, IRON, RETICCTPCT in the last 72 hours. Urine analysis:    Component Value Date/Time   COLORURINE YELLOW 08/19/2014 1854   APPEARANCEUR CLOUDY (A) 08/19/2014 1854   LABSPEC 1.012 08/19/2014 1854   PHURINE 6.0 08/19/2014 1854   GLUCOSEU NEGATIVE 08/19/2014 1854   HGBUR NEGATIVE 08/19/2014 1854   BILIRUBINUR NEGATIVE 08/19/2014 1854   KETONESUR NEGATIVE 08/19/2014 1854   PROTEINUR NEGATIVE 08/19/2014 1854   UROBILINOGEN 0.2 08/19/2014 1854   NITRITE NEGATIVE 08/19/2014 1854   LEUKOCYTESUR NEGATIVE 08/19/2014 1854   Sepsis Labs: No results found for this or any previous visit (from the past 240 hour(s)).   Radiological Exams on Admission: Dg Chest 2 View  Result Date: 10/08/2017 CLINICAL DATA:  Bilateral leg swelling and shortness of Breath EXAM: CHEST - 2 VIEW  COMPARISON:  07/01/2017 FINDINGS: Cardiac shadow is mildly enlarged but stable. Aortic calcifications and hiatal hernia are again noted and stable. Mild vascular congestion is noted without interstitial edema. No sizable effusion is noted. Mild hyperinflation of the lungs is noted. IMPRESSION: Mild vascular congestion. COPD. Hiatal hernia. Electronically Signed   By: Alcide Clever M.D.   On: 10/08/2017 19:48  EKG: Independently reviewed.  Normal sinus rhythm with rightward axis and minimal ST depressions noted in the lateral leads when compared to previous EKGs  Assessment/Plan Acute on chronic diastolic CHF (congestive heart failure): Patient presents with progressively worsening lower extremity edema and shortness of breath.  Chest x-ray showing vascular congestion BNP elevated at 463.8.  Last echocardiogram showing EF of 60% back in 05/2017.  She is followed by Dr. Desma Maxim and [facility]. - Admit to a telemetry bed - Heart failure orders set  initiated  - Continuous pulse oximetry with nasal cannula oxygen as needed to keep O2 saturations >92% - Strict I&Os and daily weights - Elevate lower extremities - Lasix 40 mg IV Bid - Reassess in a.m. and adjust diuresis as needed. - Check echocardiogram and Doppler ultrasound of lower extremities - Optimize medical management as able - Will need to consult cardiology in a.m.  Elevated troponin: Acute.  Initial troponin noted to be 0.04 on admission.  Suspect secondary to stress of patient being acutely fluid overloaded. - Trend cardiac troponins  Essential hypertension - Continue amlodipine and losartan - Verify labetalol and clonidine dosage and continue  Hypochromic anemia: Hemoglobin noted to be 7.9 on admission.  Baseline hemoglobin appears to range from 8-9.  Stool guaiac was noted to be negative.  Question previous bleed as cause of patient's drop in hemoglobin. - Type and screen for possible need of blood products. -Transfuse 1 unit of  packed red blood cells  Diabetes mellitus type 2 - Hypoglycemic protocol - Hold glipizide - CBGs q. before meals and at bedtime with moderate SSI   H/O asthma - Continue pharmacy substitution of Dulera - Duonebs prn SOB/wheezing   DVT prophylaxis: lovenox  Code Status: full  Family Communication: No family present at bedside Disposition Plan: Likely discharge home in 2-3 days Consults called: none  Admission status: Inpatient  Clydie Braun MD [facility] Pager [phone]   If 7PM-7AM, please contact night-coverage [email or web address] Password [email or web address]  10/09/2017, 1:35 AM   \

## 2017-10-09 NOTE — Plan of Care (Signed)
  Education: Knowledge of General Education information will improve 10/09/2017 0159 - Completed/Met by Theora Gianotti, RN   Pt oriented to unit, plan of care, and method of reporting concerns. Verbalizes understanding of need to call for assistance prior to ambulation. Call light and bedside table within reach.

## 2017-10-10 LAB — TYPE AND SCREEN
ABO/RH(D): O POS
ANTIBODY SCREEN: NEGATIVE
UNIT DIVISION: 0

## 2017-10-10 LAB — BASIC METABOLIC PANEL
Anion gap: 11 (ref 5–15)
BUN: 14 mg/dL (ref 6–20)
CHLORIDE: 101 mmol/L (ref 101–111)
CO2: 29 mmol/L (ref 22–32)
CREATININE: 0.87 mg/dL (ref 0.44–1.00)
Calcium: 8 mg/dL — ABNORMAL LOW (ref 8.9–10.3)
GFR calc non Af Amer: >60 mL/min (ref >60)
Glucose, Bld: 95 mg/dL (ref 65–99)
POTASSIUM: 3.2 mmol/L — AB (ref 3.5–5.1)
Sodium: 141 mmol/L (ref 135–145)

## 2017-10-10 LAB — BPAM RBC
BLOOD PRODUCT EXPIRATION DATE: 201904082359
ISSUE DATE / TIME: 201903130453
UNIT TYPE AND RH: 5100

## 2017-10-10 LAB — GLUCOSE, CAPILLARY
GLUCOSE-CAPILLARY: 149 mg/dL — AB (ref 65–99)
Glucose-Capillary: 104 mg/dL — ABNORMAL HIGH (ref 65–99)
Glucose-Capillary: 91 mg/dL (ref 65–99)

## 2017-10-10 MED ORDER — CLONIDINE HCL 0.1 MG PO TABS
0.1000 mg | ORAL_TABLET | Freq: Every day | ORAL | Status: DC
  Administered 2017-10-11 – 2017-10-14 (×4): 0.1 mg via ORAL
  Filled 2017-10-10 (×3): qty 1

## 2017-10-10 MED ORDER — POTASSIUM CHLORIDE CRYS ER 20 MEQ PO TBCR
40.0000 meq | EXTENDED_RELEASE_TABLET | Freq: Every day | ORAL | Status: DC
  Administered 2017-10-10 – 2017-10-18 (×9): 40 meq via ORAL
  Filled 2017-10-10 (×9): qty 2

## 2017-10-10 MED ORDER — LABETALOL HCL 100 MG PO TABS
100.0000 mg | ORAL_TABLET | Freq: Two times a day (BID) | ORAL | Status: DC
  Administered 2017-10-10 – 2017-10-12 (×4): 100 mg via ORAL
  Filled 2017-10-10 (×4): qty 1

## 2017-10-10 NOTE — Care Management Note (Signed)
Case Management Note  Patient Details  Name: Monique Lewis MRN: [id number] Date of Birth: 12-07-1932  Subjective/Objective:                 Spoke w patient at the bedside who states that she has a HHA that comes 3 hours a day Monday through Saturday. This aid gets patient's medication, Patient uses [employer] for transportation to MD appointments. She states that she also gets [facility] and is in the process of getting this reinstated for 2019. Patient states that he daughter also "comes by when she can." Patient has a cane, walker, and a power chair. She states she has a scale. She states she has [employer] in the past and would like to use them again as needed. Patient states she has been getting up frequently to use the bathroom and that she has been ambulating "better than I have been since I came in." PCP: Leavy CellaBoyd Cardiologist: [facility] at [facility]    Action/Plan:   Expected Discharge Date:                  Expected Discharge Plan:  Home w Home Health Services  In-House Referral:     Discharge planning Services  CM Consult  Post Acute Care Choice:    Choice offered to:     DME Arranged:    DME Agency:     HH Arranged:    HH Agency:     Status of Service:  In process, will continue to follow  If discussed at Long Length of Stay Meetings, dates discussed:    Additional Comments:  Lawerance SabalDebbie Advik Weatherspoon, RN 10/10/2017, 2:56 PM

## 2017-10-10 NOTE — Progress Notes (Signed)
Hospitalist progress note   Monique Lewis  ZOX:[id number] DOB: 02-May-1933 DOA: 10/08/2017 PCP: Verlon AuBoyd, Tammy Lamonica, MD   Specialists:   Brief Narrative:  [age] fem Known DM ty ii, gout, COPD, GERD, BIpolar, sinus brady, htn Known diastolic hf--LAst EF in our system EF 65-70% and diastolic HF Last admit 07/2014 with acute hf--dry weight appears ~ 203 to 205 [last OV]   Sees Dr. Desma MaximMcGukin of cardiology -last seens 08/28/17  Had ECHO EF60% 05/2017--Imdur added at that time     Assessment & Plan:   Assessment:  The primary encounter diagnosis was Acute on chronic congestive heart failure, unspecified heart failure type (HCC). Diagnoses of Iron deficiency anemia, unspecified iron deficiency anemia type and Elevated troponin were also pertinent to this visit.  Acute decompensated diastolic hf-ECHo pending-had on last yr as above--subj feels better-fluid sttus still not neg.  Considering increasing lasix to 60 iv bid in the next 24 hr +/- metalozone Adding TED hose today  DM ty ii-not apparently on any home meds-wll enquire  Bipolar-cont lexapro10 daily, sinequan 50 hs sleep  Htn-BP not controlled yesterday-holding for now losartan-allergic to hydralazine cont coreg 3.125 bd, keep clnidine 0.1 mg today--willa djust in am as prn  Sinus brady and episodic vtach-probably has some sick sinus-monitor HR and keepon tele, check mag in am  Hypok-replacing with kdur 40--recheck am   DVT prophylaxis: lovenox  Code Status:   full   Family Communication:    none  Disposition Plan:  I[p pending resolution edema and hf   Consultants:   none  Procedures:   echo  Antimicrobials:   none   Subjective:  better Breathing better  no cp no sob Still swollen Has only been up to RR  Objective: Vitals:   10/10/17 0416 10/10/17 0957 10/10/17 1132 10/10/17 1358  BP: (!) 176/65  (!) 143/87 (!) 163/67  Pulse: (!) 56  (!) 111 62  Resp:   20   Temp: 97.9 F (36.6 C)  97.8 F (36.6 C) 98.1 F  (36.7 C)  TempSrc: Oral  Oral Oral  SpO2: 97% 99% 94%   Weight: 87.3 kg (192 lb 7.4 oz)     Height:        Intake/Output Summary (Last 24 hours) at 10/10/2017 1633 Last data filed at 10/10/2017 1620 Gross per 24 hour  Intake 663 ml  Output 1600 ml  Net -937 ml   Filed Weights   10/08/17 1901 10/10/17 0416  Weight: 87.5 kg (193 lb) 87.3 kg (192 lb 7.4 oz)    Examination: Obese pelasan tin nad no distress, no n/v cta b hsm across precordium abd obese nt nd grd 2 LE edema Neuro intact and psych euthymic  Data Reviewed: I have personally reviewed following labs and imaging studies  CBC: Recent Labs  Lab 10/08/17 1921 10/09/17 0328 10/09/17 1031  WBC 10.3 8.9 9.1  NEUTROABS 5.4 4.7  --   HGB 7.9* 7.5* 8.4*  HCT 25.8* 24.6* 27.9*  MCV 81.6 82.6 82.5  PLT 234 226 215   Basic Metabolic Panel: Recent Labs  Lab 10/08/17 1921 10/10/17 0541  NA 138 141  K 3.6 3.2*  CL 98* 101  CO2 30 29  GLUCOSE 229* 95  BUN 24* 14  CREATININE 1.07* 0.87  CALCIUM 8.6* 8.0*   GFR: Estimated Creatinine Clearance: 49 mL/min (by C-G formula based on SCr of 0.87 mg/dL). Liver Function Tests: No results for input(s): AST, ALT, ALKPHOS, BILITOT, PROT, ALBUMIN in the last 168 hours.  No results for input(s): LIPASE, AMYLASE in the last 168 hours. No results for input(s): AMMONIA in the last 168 hours. Coagulation Profile: No results for input(s): INR, PROTIME in the last 168 hours. Cardiac Enzymes: Recent Labs  Lab 10/08/17 1921 10/09/17 0328 10/09/17 1031 10/09/17 1542  TROPONINI 0.04* 0.03* 0.03* 0.03*   CBG: Recent Labs  Lab 10/09/17 1112 10/09/17 1636 10/09/17 2109 10/10/17 0722 10/10/17 1600  GLUCAP 127* 134* 129* 104* 149*   Urine analysis:    Component Value Date/Time   COLORURINE YELLOW 08/19/2014 1854   APPEARANCEUR CLOUDY (A) 08/19/2014 1854   LABSPEC 1.012 08/19/2014 1854   PHURINE 6.0 08/19/2014 1854   GLUCOSEU NEGATIVE 08/19/2014 1854   HGBUR NEGATIVE  08/19/2014 1854   BILIRUBINUR NEGATIVE 08/19/2014 1854   KETONESUR NEGATIVE 08/19/2014 1854   PROTEINUR NEGATIVE 08/19/2014 1854   UROBILINOGEN 0.2 08/19/2014 1854   NITRITE NEGATIVE 08/19/2014 1854   LEUKOCYTESUR NEGATIVE 08/19/2014 1854     Radiology Studies: Reviewed images personally in health database    Scheduled Meds: . amLODipine  5 mg Oral Daily  . cloNIDine  0.1 mg Oral Daily  . cloNIDine  0.1 mg Oral Daily  . doxepin  50 mg Oral QHS  . enoxaparin (LOVENOX) injection  40 mg Subcutaneous Q24H  . escitalopram  10 mg Oral Daily  . furosemide  40 mg Intravenous BID  . insulin aspart  0-15 Units Subcutaneous TID WC  . insulin aspart  0-5 Units Subcutaneous QHS  . labetalol  100 mg Oral BID  . losartan  50 mg Oral Daily  . mometasone-formoterol  2 puff Inhalation BID  . potassium chloride  40 mEq Oral Daily  . sodium chloride flush  3 mL Intravenous Q12H   Continuous Infusions: . sodium chloride    . sodium chloride Stopped (10/09/17 0234)     LOS: 2 days    Time spent: 81    Pleas Koch, MD [facility]   If 7PM-7AM, please contact night-coverage [email or web address] Password [email or web address] 10/10/2017, 4:33 PM

## 2017-10-10 NOTE — Plan of Care (Signed)
Administer IV diuretics as ordered. Potassium supplementation for hypo kalemia. Echo to be done.

## 2017-10-11 ENCOUNTER — Inpatient Hospital Stay (HOSPITAL_COMMUNITY): Payer: Medicare Other

## 2017-10-11 LAB — ECHOCARDIOGRAM COMPLETE
Height: 60 in
Weight: 3082.91 oz

## 2017-10-11 LAB — COMPREHENSIVE METABOLIC PANEL
ALK PHOS: 56 U/L (ref 38–126)
ALT: 27 U/L (ref 14–54)
AST: 72 U/L — ABNORMAL HIGH (ref 15–41)
Albumin: 2.8 g/dL — ABNORMAL LOW (ref 3.5–5.0)
Anion gap: 10 (ref 5–15)
BILIRUBIN TOTAL: 1.3 mg/dL — AB (ref 0.3–1.2)
BUN: 16 mg/dL (ref 6–20)
CALCIUM: 8.4 mg/dL — AB (ref 8.9–10.3)
CO2: 27 mmol/L (ref 22–32)
CREATININE: 0.92 mg/dL (ref 0.44–1.00)
Chloride: 104 mmol/L (ref 101–111)
GFR, EST NON AFRICAN AMERICAN: 56 mL/min — AB (ref >60)
Glucose, Bld: 124 mg/dL — ABNORMAL HIGH (ref 65–99)
Potassium: 4.8 mmol/L (ref 3.5–5.1)
Sodium: 141 mmol/L (ref 135–145)
Total Protein: 6.1 g/dL — ABNORMAL LOW (ref 6.5–8.1)

## 2017-10-11 LAB — GLUCOSE, CAPILLARY
GLUCOSE-CAPILLARY: 118 mg/dL — AB (ref 65–99)
GLUCOSE-CAPILLARY: 208 mg/dL — AB (ref 65–99)
Glucose-Capillary: 104 mg/dL — ABNORMAL HIGH (ref 65–99)
Glucose-Capillary: 80 mg/dL (ref 65–99)

## 2017-10-11 LAB — MAGNESIUM: MAGNESIUM: 1.4 mg/dL — AB (ref 1.7–2.4)

## 2017-10-11 MED ORDER — FUROSEMIDE 10 MG/ML IJ SOLN
60.0000 mg | Freq: Two times a day (BID) | INTRAMUSCULAR | Status: DC
  Administered 2017-10-11 – 2017-10-12 (×3): 60 mg via INTRAVENOUS
  Filled 2017-10-11 (×3): qty 6

## 2017-10-11 MED ORDER — FAMOTIDINE 40 MG/5ML PO SUSR
20.0000 mg | Freq: Two times a day (BID) | ORAL | Status: DC
  Administered 2017-10-11 – 2017-10-18 (×15): 20 mg via ORAL
  Filled 2017-10-11 (×16): qty 2.5

## 2017-10-11 NOTE — Progress Notes (Signed)
Hospitalist progress note   Monique Lewis  ONG:[id number] DOB: [birth year] DOA: 10/08/2017 PCP: Verlon Au, MD   Specialists:   Brief Narrative:  57 fem Known DM ty ii, gout, COPD, GERD, BIpolar, sinus brady, htn Known diastolic hf--LAst EF in our system EF 65-70% and diastolic HF Last admit 07/2014 with acute hf--dry weight appears ~ 203 to 205 [last OV]   Sees Dr. Desma Maxim of cardiology -last seens 08/28/17  Had ECHO EF60% 05/2017--Imdur added at that time    Assessment & Plan:   Assessment:  The primary encounter diagnosis was Acute on chronic congestive heart failure, unspecified heart failure type (HCC). Diagnoses of Iron deficiency anemia, unspecified iron deficiency anemia type and Elevated troponin were also pertinent to this visit.  Acute decompensated diastolic hf-ECHo pending-had on last yr as above--subj feels better-fluid sttus still not neg.   Increasing today lasix to 60 iv bid  Adding TED hose today Net I/o is stable today without much diuresis consulted Dr. Algie Coffer for some HF oversight  DM ty ii-not apparently on any home meds-sugars 104--124  Bipolar-cont lexapro10 daily, sinequan 50 hs sleep  Htn-BP not controlled yesterday-holding for now losartan-allergic to hydralazine cont coreg 3.125 bd, keep clnidine 0.1 mg today.  Sinus brady and episodic vtach-probably has some sick sinus-monitor HR and keep on tele, check mag in am  Hypok-replacing with kdur 40--recheck am   DVT prophylaxis: lovenox  Code Status:   full   Family Communication:    none  Disposition Plan:  I[p pending resolution edema and hf   Consultants:   none  Procedures:   echo  Antimicrobials:   none   Subjective:  Fair  Just back from Echo Overall feeling well No sob  Objective: Vitals:   10/11/17 0744 10/11/17 0814 10/11/17 1221 10/11/17 1504  BP:  (!) 206/77 (!) 153/80 (!) 144/56  Pulse:      Resp:      Temp:    97.6 F (36.4 C)  TempSrc:    Oral  SpO2: 98%    99%  Weight:      Height:        Intake/Output Summary (Last 24 hours) at 10/11/2017 1542 Last data filed at 10/11/2017 1300 Gross per 24 hour  Intake 703 ml  Output 950 ml  Net -247 ml   Filed Weights   10/08/17 1901 10/10/17 0416 10/11/17 0500  Weight: 87.5 kg (193 lb) 87.3 kg (192 lb 7.4 oz) 87.4 kg (192 lb 10.9 oz)    Examination:  Obese pleasant in nad no distress, no n/v cta b hsm across precordium abd obese nt nd grd 1 LE edema but seems decreased Neuro intact and psych euthymic, no distress  msk intact  Data Reviewed: I have personally reviewed following labs and imaging studies  CBC: Recent Labs  Lab 10/08/17 1921 10/09/17 0328 10/09/17 1031  WBC 10.3 8.9 9.1  NEUTROABS 5.4 4.7  --   HGB 7.9* 7.5* 8.4*  HCT 25.8* 24.6* 27.9*  MCV 81.6 82.6 82.5  PLT 234 226 215   Basic Metabolic Panel: Recent Labs  Lab 10/08/17 1921 10/10/17 0541 10/11/17 0655  NA 138 141 141  K 3.6 3.2* 4.8  CL 98* 101 104  CO2 30 29 27   GLUCOSE 229* 95 124*  BUN 24* 14 16  CREATININE 1.07* 0.87 0.92  CALCIUM 8.6* 8.0* 8.4*  MG  --   --  1.4*   GFR: Estimated Creatinine Clearance: 46.4 mL/min (by C-G formula based  on SCr of 0.92 mg/dL). Liver Function Tests: Recent Labs  Lab 10/11/17 0655  AST 72*  ALT 27  ALKPHOS 56  BILITOT 1.3*  PROT 6.1*  ALBUMIN 2.8*   No results for input(s): LIPASE, AMYLASE in the last 168 hours. No results for input(s): AMMONIA in the last 168 hours. Coagulation Profile: No results for input(s): INR, PROTIME in the last 168 hours. Cardiac Enzymes: Recent Labs  Lab 10/08/17 1921 10/09/17 0328 10/09/17 1031 10/09/17 1542  TROPONINI 0.04* 0.03* 0.03* 0.03*   CBG: Recent Labs  Lab 10/10/17 0722 10/10/17 1600 10/10/17 2129 10/11/17 0804 10/11/17 1213  GLUCAP 104* 149* 91 104* 118*   Urine analysis:    Component Value Date/Time   COLORURINE YELLOW 08/19/2014 1854   APPEARANCEUR CLOUDY (A) 08/19/2014 1854   LABSPEC 1.012  08/19/2014 1854   PHURINE 6.0 08/19/2014 1854   GLUCOSEU NEGATIVE 08/19/2014 1854   HGBUR NEGATIVE 08/19/2014 1854   BILIRUBINUR NEGATIVE 08/19/2014 1854   KETONESUR NEGATIVE 08/19/2014 1854   PROTEINUR NEGATIVE 08/19/2014 1854   UROBILINOGEN 0.2 08/19/2014 1854   NITRITE NEGATIVE 08/19/2014 1854   LEUKOCYTESUR NEGATIVE 08/19/2014 1854     Radiology Studies: Reviewed images personally in health database    Scheduled Meds: . amLODipine  5 mg Oral Daily  . cloNIDine  0.1 mg Oral Daily  . doxepin  50 mg Oral QHS  . enoxaparin (LOVENOX) injection  40 mg Subcutaneous Q24H  . escitalopram  10 mg Oral Daily  . famotidine  20 mg Oral BID  . furosemide  60 mg Intravenous BID  . insulin aspart  0-15 Units Subcutaneous TID WC  . insulin aspart  0-5 Units Subcutaneous QHS  . labetalol  100 mg Oral BID  . losartan  50 mg Oral Daily  . mometasone-formoterol  2 puff Inhalation BID  . potassium chloride  40 mEq Oral Daily  . sodium chloride flush  3 mL Intravenous Q12H   Continuous Infusions: . sodium chloride    . sodium chloride Stopped (10/09/17 0234)     LOS: 3 days    Time spent: 4125    Pleas KochJai Davonne Jarnigan, MD [facility](P) [phone]   If 7PM-7AM, please contact night-coverage [email or web address] Password [email or web address] 10/11/2017, 3:42 PM

## 2017-10-11 NOTE — Consult Note (Signed)
Referring Physician:  TAALIYAH DELPRIORE is an 84 y.o. female.                       Chief Complaint: Shortness of breath and leg edema  HPI: 84 year old female with PMH of hypertension, preserved LV systolic function heart failure, Asthma, COPD, type II DM and GERD had increasing shortness of breath and leg edema. Chest x-ray was positive for vascular congestion and BNP was somewhat elevated in obese person.  Past Medical History:  Diagnosis Date  . Allergy   . Anginal pain ([place])   . Arthritis    "all over my body" (06/10/12)  . Asthma 06/10/2012  . CHF (congestive heart failure) ([place])   . Chronic lower back pain   . COPD (chronic obstructive pulmonary disease) ([place])   . Depression 06/10/2012  . Exertional dyspnea   . Family history of adverse reaction to anesthesia    " my sister had complications but I dont know what "  . GERD (gastroesophageal reflux disease) 06/10/2012  . Gout 06/10/2012  . Headache(784.0)    "severe last 3 wk" (06/10/12)  . Heart murmur   . Hepatitis-C 06/10/2012   Hx of Hep C after knee surgery 2001 s/p treatment per patient.  . History of blood transfusion    "several; first one I caught Hepatitis" (06/10/12)  . HTN (hypertension) 06/10/2012  . Hyperpotassemia   . Hypertension   . Iron deficiency anemia 06/10/2012  . Pneumonia ~ 2001  . Recurrent UTI (urinary tract infection)    "get them maybe q 6 months" (06/10/12)      Past Surgical History:  Procedure Laterality Date  . CHOLECYSTECTOMY  2006  . DILATION AND CURETTAGE OF UTERUS    . JOINT REPLACEMENT     s/p 5 knee replacements  . TONSILLECTOMY AND ADENOIDECTOMY  1940's  . TOTAL KNEE ARTHROPLASTY  2001 - 2011   "5 total; have had them done on both knees" (06/10/12)    No family history on file. Social History:  reports that  has never smoked. she has never used smokeless tobacco. She reports that she does not drink alcohol or use drugs.  Allergies:  Allergies  Allergen Reactions  .  Celebrex [Celecoxib] Itching  . Hydralazine Itching and Other (See Comments)       . Ibuprofen Hives  . Sulfa Antibiotics Itching  . Codeine Itching  . Isosorbide Mononitrate [Isosorbide Dinitrate Er] Hives and Itching  . Shellfish Allergy Itching  . Iodine Rash  . Latex Rash    Medications Prior to Admission  Medication Sig Dispense Refill  . albuterol (PROVENTIL HFA;VENTOLIN HFA) 108 (90 BASE) MCG/ACT inhaler Inhale 2 puffs into the lungs every 4 (four) hours as needed. For wheezing    . budesonide-formoterol (SYMBICORT) 160-4.5 MCG/ACT inhaler Inhale 2 puffs into the lungs 2 (two) times daily.    . carvedilol (COREG) 3.125 MG tablet Take 3.125 mg by mouth 2 (two) times daily with a meal.     . clobetasol cream (TEMOVATE) 7.09 % Apply 1 application topically 2 (two) times daily. To right hip 30 g 0  . cloNIDine (CATAPRES) 0.1 MG tablet Take 1 tablet (0.1 mg total) by mouth daily. 60 tablet 11  . desonide (DESOWEN) 0.05 % cream Apply 1 application topically 2 (two) times daily.     Marland Kitchen doxepin (SINEQUAN) 50 MG capsule Take 50 mg by mouth at bedtime.    Marland Kitchen escitalopram (LEXAPRO) 10 MG tablet  Take 10 mg by mouth daily.    . fluticasone (FLONASE) 50 MCG/ACT nasal spray Place 2 sprays into the nose daily.    . hydrOXYzine (ATARAX/VISTARIL) 25 MG tablet Take 25 mg by mouth every 4 (four) hours as needed. For itching    . labetalol (NORMODYNE) 100 MG tablet Take 100 mg by mouth 2 (two) times daily.    Marland Kitchen losartan (COZAAR) 100 MG tablet Take 100 mg by mouth daily.    . pantoprazole (PROTONIX) 40 MG tablet Take 40 mg by mouth daily.    . potassium chloride SA (K-DUR,KLOR-CON) 20 MEQ tablet Take 1 tablet (20 mEq total) by mouth 2 (two) times daily. 60 tablet 1  . torsemide (DEMADEX) 20 MG tablet Take 20 mg by mouth 2 (two) times daily.      Results for orders placed or performed during the hospital encounter of 10/08/17 (from the past 48 hour(s))  Troponin I     Status: Abnormal   Collection  Time: 10/09/17 10:31 AM  Result Value Ref Range   Troponin I 0.03 (HH) <0.03 ng/mL    Comment: CRITICAL VALUE NOTED.  VALUE IS CONSISTENT WITH PREVIOUSLY REPORTED AND CALLED VALUE. Performed at [facility], [place] [address], [place], [place] [address]   CBC     Status: Abnormal   Collection Time: 10/09/17 10:31 AM  Result Value Ref Range   WBC 9.1 4.0 - 10.5 K/uL   RBC 3.38 (L) 3.87 - 5.11 MIL/uL   Hemoglobin 8.4 (L) 12.0 - 15.0 g/dL   HCT 27.9 (L) 36.0 - 46.0 %   MCV 82.5 78.0 - 100.0 fL   MCH 24.9 (L) 26.0 - 34.0 pg   MCHC 30.1 30.0 - 36.0 g/dL   RDW 17.4 (H) 11.5 - 15.5 %   Platelets 215 150 - 400 K/uL    Comment: Performed at [facility], [place] [address] [place], [place] [address]  Glucose, capillary     Status: Abnormal   Collection Time: 10/09/17 11:12 AM  Result Value Ref Range   Glucose-Capillary 127 (H) 65 - 99 mg/dL  Troponin I     Status: Abnormal   Collection Time: 10/09/17  3:42 PM  Result Value Ref Range   Troponin I 0.03 (HH) <0.03 ng/mL    Comment: CRITICAL VALUE NOTED.  VALUE IS CONSISTENT WITH PREVIOUSLY REPORTED AND CALLED VALUE. Performed at [facility], [place] [address] [place], [place] [address]   Glucose, capillary     Status: Abnormal   Collection Time: 10/09/17  4:36 PM  Result Value Ref Range   Glucose-Capillary 134 (H) 65 - 99 mg/dL  Glucose, capillary     Status: Abnormal   Collection Time: 10/09/17  9:09 PM  Result Value Ref Range   Glucose-Capillary 129 (H) 65 - 99 mg/dL  Basic metabolic panel     Status: Abnormal   Collection Time: 10/10/17  5:41 AM  Result Value Ref Range   Sodium 141 135 - 145 mmol/L   Potassium 3.2 (L) 3.5 - 5.1 mmol/L   Chloride 101 101 - 111 mmol/L   CO2 29 22 - 32 mmol/L   Glucose, Bld 95 65 - 99 mg/dL   BUN 14 6 - 20 mg/dL   Creatinine, Ser 0.87 0.44 - 1.00 mg/dL   Calcium 8.0 (L) 8.9 - 10.3 mg/dL   GFR calc non Af Amer >60 >60 mL/min   GFR calc Af Amer >60 >60 mL/min    Comment: (NOTE)  The  eGFR has been calculated using the CKD EPI equation. This calculation has not been validated in all clinical situations. eGFR's persistently <60 mL/min signify possible Chronic Kidney Disease.    Anion gap 11 5 - 15    Comment: Performed at [facility] [address] [place], [place] [address]  Glucose, capillary     Status: Abnormal   Collection Time: 10/10/17  7:22 AM  Result Value Ref Range   Glucose-Capillary 104 (H) 65 - 99 mg/dL  Glucose, capillary     Status: Abnormal   Collection Time: 10/10/17  4:00 PM  Result Value Ref Range   Glucose-Capillary 149 (H) 65 - 99 mg/dL  Glucose, capillary     Status: None   Collection Time: 10/10/17  9:29 PM  Result Value Ref Range   Glucose-Capillary 91 65 - 99 mg/dL  Comprehensive metabolic panel     Status: Abnormal   Collection Time: 10/11/17  6:55 AM  Result Value Ref Range   Sodium 141 135 - 145 mmol/L   Potassium 4.8 3.5 - 5.1 mmol/L    Comment: HEMOLYSIS AT THIS LEVEL MAY AFFECT RESULT   Chloride 104 101 - 111 mmol/L   CO2 27 22 - 32 mmol/L   Glucose, Bld 124 (H) 65 - 99 mg/dL   BUN 16 6 - 20 mg/dL   Creatinine, Ser 0.92 0.44 - 1.00 mg/dL   Calcium 8.4 (L) 8.9 - 10.3 mg/dL   Total Protein 6.1 (L) 6.5 - 8.1 g/dL   Albumin 2.8 (L) 3.5 - 5.0 g/dL   AST 72 (H) 15 - 41 U/L   ALT 27 14 - 54 U/L   Alkaline Phosphatase 56 38 - 126 U/L   Total Bilirubin 1.3 (H) 0.3 - 1.2 mg/dL   GFR calc non Af Amer 56 (L) >60 mL/min   GFR calc Af Amer >60 >60 mL/min    Comment: (NOTE) The eGFR has been calculated using the CKD EPI equation. This calculation has not been validated in all clinical situations. eGFR's persistently <60 mL/min signify possible Chronic Kidney Disease.    Anion gap 10 5 - 15    Comment: Performed at [facility] [address] [place], [place] [address]  Magnesium     Status: Abnormal   Collection Time: 10/11/17  6:55 AM  Result Value Ref Range   Magnesium 1.4 (L) 1.7 - 2.4 mg/dL    Comment:  Performed at [facility] [address], [place], [place] [address]  Glucose, capillary     Status: Abnormal   Collection Time: 10/11/17  8:04 AM  Result Value Ref Range   Glucose-Capillary 104 (H) 65 - 99 mg/dL   No results found.  Review Of Systems Constitutional: No fever, chills, positive weight gain. Eyes: No vision change, wears glasses. No discharge or pain. Ears: No hearing loss, No tinnitus. Respiratory: No asthma, COPD, pneumonias. Positive shortness of breath. No hemoptysis. Cardiovascular: Occasional chest pain, palpitation, leg edema. Gastrointestinal: No nausea, vomiting, diarrhea, constipation. No GI bleed. No hepatitis. Genitourinary: No dysuria, hematuria, kidney stone. No incontinance. Neurological: No headache, stroke, seizures.  Psychiatry: No psych facility admission for anxiety, depression, suicide. No detox. Skin: No rash. Musculoskeletal: Positive joint pain, fibromyalgia. No neck pain, back pain. Lymphadenopathy: No lymphadenopathy. Hematology: No anemia or easy bruising.   Blood pressure (!) 206/77, pulse (!) 57, temperature 97.9 F (36.6 C), temperature source Oral, resp. rate 20, height 5' (1.524 m), weight 87.4 kg (192  lb 10.9 oz), SpO2 98 %. Body mass index is 37.63 kg/m. General appearance: alert, cooperative, appears stated age and no distress Head: Normocephalic, atraumatic. Eyes: Brown eyes, pale conjunctiva, corneas clear. PERRL, EOM's intact. Neck: No adenopathy, no carotid bruit, no JVD, supple, symmetrical, trachea midline and thyroid not enlarged. Resp: Clearing to auscultation bilaterally. Cardio: Regular rate and rhythm, S1, S2 normal, II/VI systolic murmur, no click, rub or gallop GI: Soft, non-tender; bowel sounds normal; no organomegaly. Extremities: Trace edema, no cyanosis or clubbing. Skin: Warm and dry.  Neurologic: Alert and oriented X 3, normal strength. Normal coordination and gait.  Assessment/Plan Acute on chronic CHF  with preserved LV systolic function Obesity Hypertension Type II DM COPD Anemia of chronic disease Hypokalemia  Agree with controlled diuresis and PRBC transfusion and potassium supplementation. Echocardiogram - pending.                       Birdie Riddle, MD  10/11/2017, 8:42 AM

## 2017-10-11 NOTE — Evaluation (Signed)
Physical Therapy Evaluation Patient Details Name: Monique Lewis MRN: [id number] DOB: 1933-03-04 Today's Date: 10/11/2017   History of Present Illness  Monique Lewis is a 84 y.o. female with medical history significant of HTN, HLD, diastolic CHF last EF noted to be 60-65% in 05/2017, asthma/COPD, DM type II, and GERD; who presents with complaints of progressively worsening shortness of breath and leg swelling.  Clinical Impression  Pt admitted with/for SOB due to CHF.  Pt improving, but not quite to baseline function though vital stayed relatively stable.  Pt currently limited functionally due to the problems listed below.  (see problems list.)  Pt will benefit from PT to maximize function and safety to be able to get home safely with available assist.     Follow Up Recommendations Supervision - Intermittent;Home health PT    Equipment Recommendations  None recommended by PT    Recommendations for Other Services       Precautions / Restrictions Precautions Precautions: Fall      Mobility  Bed Mobility Overal bed mobility: Needs Assistance Bed Mobility: Supine to Sit     Supine to sit: Supervision     General bed mobility comments: no rail needed, scooted to EOB with ease while attending to pulling up undergarment  Transfers Overall transfer level: Needs assistance   Transfers: Sit to/from Stand Sit to Stand: Min guard         General transfer comment: cue for safer hand placement  Ambulation/Gait Ambulation/Gait assistance: Min guard Ambulation Distance (Feet): 150 Feet Assistive device: Rolling walker (2 wheeled) Gait Pattern/deviations: Step-through pattern   Gait velocity interpretation: Below normal speed for age/gender General Gait Details: generally steady gait, mild dyspnea, but sats on RA in mid 90's and EHR low 90's  Stairs            Wheelchair Mobility    Modified Rankin (Stroke Patients Only)       Balance Overall balance  assessment: Needs assistance   Sitting balance-Leahy Scale: Good       Standing balance-Leahy Scale: Fair Standing balance comment: could maintain static stance without AD                             Pertinent Vitals/Pain Pain Assessment: No/denies pain    Home Living Family/patient expects to be discharged to:: Private residence Living Arrangements: Alone Available Help at Discharge: Family;Available PRN/intermittently;Personal care attendant(PCA 3 hours 5-6 days per week) Type of Home: Apartment Home Access: Elevator     Home Layout: One level Home Equipment: Wheelchair - power;Walker - 2 wheels;Cane - single point      Prior Function Level of Independence: Independent         Comments: taken to the grocery store by PCA     Hand Dominance        Extremity/Trunk Assessment   Upper Extremity Assessment Upper Extremity Assessment: Overall WFL for tasks assessed    Lower Extremity Assessment Lower Extremity Assessment: Overall WFL for tasks assessed;RLE deficits/detail;LLE deficits/detail RLE Deficits / Details: general weakness esp proximally, with limited knee flexion ROM RLE Coordination: decreased gross motor LLE Deficits / Details: limited knee flexion ROM, general proximal weakness       Communication   Communication: No difficulties  Cognition Arousal/Alertness: Awake/alert Behavior During Therapy: WFL for tasks assessed/performed Overall Cognitive Status: Within Functional Limits for tasks assessed  General Comments      Exercises     Assessment/Plan    PT Assessment Patient needs continued PT services  PT Problem List Decreased strength;Decreased activity tolerance;Decreased balance;Decreased mobility;Decreased knowledge of use of DME;Cardiopulmonary status limiting activity       PT Treatment Interventions Gait training;Functional mobility training;Therapeutic  activities;DME instruction;Balance training;Patient/family education    PT Goals (Current goals can be found in the Care Plan section)  Acute Rehab PT Goals Patient Stated Goal: back home feeling better PT Goal Formulation: With patient Time For Goal Achievement: 10/18/17 Potential to Achieve Goals: Good    Frequency Min 3X/week   Barriers to discharge        Co-evaluation               AM-PAC PT "6 Clicks" Daily Activity  Outcome Measure Difficulty turning over in bed (including adjusting bedclothes, sheets and blankets)?: None Difficulty moving from lying on back to sitting on the side of the bed? : None Difficulty sitting down on and standing up from a chair with arms (e.g., wheelchair, bedside commode, etc,.)?: None Help needed moving to and from a bed to chair (including a wheelchair)?: A Little Help needed walking in hospital room?: A Little Help needed climbing 3-5 steps with a railing? : A Little 6 Click Score: 21    End of Session   Activity Tolerance: Patient tolerated treatment well Patient left: in chair;with call bell/phone within reach;with chair alarm set Nurse Communication: Mobility status PT Visit Diagnosis: Other abnormalities of gait and mobility (R26.89)    Time: 1610-9604 PT Time Calculation (min) (ACUTE ONLY): 37 min   Charges:   PT Evaluation $PT Eval Moderate Complexity: 1 Mod PT Treatments $Gait Training: 8-22 mins   PT G Codes:        2017/10/20  Sea Bright Bing, PT [phone]  (pager)  Monique Lewis 10-20-17, 5:06 PM

## 2017-10-12 LAB — BASIC METABOLIC PANEL
ANION GAP: 10 (ref 5–15)
BUN: 16 mg/dL (ref 6–20)
CALCIUM: 8.6 mg/dL — AB (ref 8.9–10.3)
CO2: 28 mmol/L (ref 22–32)
Chloride: 104 mmol/L (ref 101–111)
Creatinine, Ser: 0.94 mg/dL (ref 0.44–1.00)
GFR calc non Af Amer: 55 mL/min — ABNORMAL LOW (ref >60)
GLUCOSE: 97 mg/dL (ref 65–99)
POTASSIUM: 3.6 mmol/L (ref 3.5–5.1)
Sodium: 142 mmol/L (ref 135–145)

## 2017-10-12 LAB — GLUCOSE, CAPILLARY
GLUCOSE-CAPILLARY: 124 mg/dL — AB (ref 65–99)
GLUCOSE-CAPILLARY: 133 mg/dL — AB (ref 65–99)
GLUCOSE-CAPILLARY: 144 mg/dL — AB (ref 65–99)
Glucose-Capillary: 91 mg/dL (ref 65–99)

## 2017-10-12 MED ORDER — FUROSEMIDE 10 MG/ML IJ SOLN
80.0000 mg | Freq: Two times a day (BID) | INTRAMUSCULAR | Status: DC
  Administered 2017-10-12 – 2017-10-16 (×9): 80 mg via INTRAVENOUS
  Filled 2017-10-12 (×10): qty 8

## 2017-10-12 MED ORDER — LABETALOL HCL 200 MG PO TABS
200.0000 mg | ORAL_TABLET | Freq: Two times a day (BID) | ORAL | Status: DC
  Administered 2017-10-12 – 2017-10-13 (×3): 200 mg via ORAL
  Filled 2017-10-12 (×4): qty 1

## 2017-10-12 NOTE — Progress Notes (Signed)
Hospitalist progress note   Monique Lewis  ZOX:[id number] DOB: 12/10/1932 DOA: 10/08/2017 PCP: Verlon Au, MD   Specialists:   Brief Narrative:  21 fem Known DM ty ii, gout, COPD, GERD, BIpolar, sinus brady, htn Known diastolic hf--LAst EF in our system EF 65-70% and diastolic HF Last admit 07/2014 with acute hf--dry weight appears ~ 203 to 205 [last OV]   Sees Dr. Desma Maxim of cardiology -last seens 08/28/17  Had ECHO EF60% 05/2017--Imdur added at that time    Assessment & Plan:   Assessment:  The primary encounter diagnosis was Acute on chronic congestive heart failure, unspecified heart failure type (HCC). Diagnoses of Iron deficiency anemia, unspecified iron deficiency anemia type and Elevated troponin were also pertinent to this visit.  Acute decompensated diastolic hf-ECHo pending-had on last yr as above--subj feels better-fluid sttus still not neg.   Increasing today lasix to 60 iv bid -->80 iv bid I/o inaccurate, weight inaccurate 3 glasses of water at bedsdie--enforce 1500 cc restriciton Adding TED hose today consulted Dr. Algie Coffer for some HF oversight-appreciate input  DM ty ii-not apparently on any home meds-sugars 80-90's-stop checking sugars  Bipolar-cont lexapro 10 daily, sinequan 50 hs sleep  Htn-BP not controlled yesterday-holding for now losartan-allergic to hydralazine-increase labetalol 100-->200 bid 3/16, cont Norvasc 5, cont Cozaar 50 qd  cl0nidine 0.1 mg.    Sinus brady and episodic vtach-probably has some sick sinus-monitor HR-stable without changes  Hypok-replacing with kdur 40--recheck was 3.6 Labs am   DVT prophylaxis: lovenox  Code Status:   full   Family Communication:    none  Disposition Plan:   pending resolution edema and hf   Consultants:   none  Procedures:   Echo 3/15 Study Conclusions  - Left ventricle: The cavity size was normal. There was moderate   concentric hypertrophy. Systolic function was vigorous. The   estimated  ejection fraction was in the range of 65% to 70%. Wall   motion was normal; there were no regional wall motion   abnormalities. Features are consistent with a pseudonormal left   ventricular filling pattern, with concomitant abnormal relaxation   and increased filling pressure (grade 2 diastolic dysfunction). - Aortic valve: Trileaflet; mildly thickened, mildly calcified   leaflets. There was trivial regurgitation. - Mitral valve: Calcified annulus. There was severe regurgitation. - Left atrium: The atrium was severely dilated. - Right atrium: The atrium was severely dilated. - Tricuspid valve: There was severe regurgitation. - Pulmonary arteries: Systolic pressure was moderately increased.   PA peak pressure: 47 mm Hg (S). - Pericardium, extracardiac: A trivial pericardial effusion was   identified posterior to the heart.   Antimicrobials:   none   Subjective:  Fair  Some sob overnight Non compliant fluid restriction  Objective: Vitals:   10/12/17 0536 10/12/17 0643 10/12/17 0920 10/12/17 0923  BP: (!) 188/64  (!) 192/82   Pulse: (!) 56     Resp:      Temp: 98.1 F (36.7 C)     TempSrc: Oral     SpO2: 97%   91%  Weight:  92.1 kg (203 lb)    Height:        Intake/Output Summary (Last 24 hours) at 10/12/2017 1057 Last data filed at 10/12/2017 0900 Gross per 24 hour  Intake 940 ml  Output 800 ml  Net 140 ml   Filed Weights   10/10/17 0416 10/11/17 0500 10/12/17 0643  Weight: 87.3 kg (192 lb 7.4 oz) 87.4 kg (192 lb 10.9 oz)  92.1 kg (203 lb)    Examination:  Obese pleasant in nad no distress, no n/v cta b hsm across precordium abd obese nt nd grd 1 LE edema stable--no TEDs on Neuro intact and psych euthymic, no distress  msk intact  Data Reviewed: I have personally reviewed following labs and imaging studies  CBC: Recent Labs  Lab 10/08/17 1921 10/09/17 0328 10/09/17 1031  WBC 10.3 8.9 9.1  NEUTROABS 5.4 4.7  --   HGB 7.9* 7.5* 8.4*  HCT 25.8*  24.6* 27.9*  MCV 81.6 82.6 82.5  PLT 234 226 215   Basic Metabolic Panel: Recent Labs  Lab 10/08/17 1921 10/10/17 0541 10/11/17 0655 10/12/17 0421  NA 138 141 141 142  K 3.6 3.2* 4.8 3.6  CL 98* 101 104 104  CO2 30 29 27 28   GLUCOSE 229* 95 124* 97  BUN 24* 14 16 16   CREATININE 1.07* 0.87 0.92 0.94  CALCIUM 8.6* 8.0* 8.4* 8.6*  MG  --   --  1.4*  --    GFR: Estimated Creatinine Clearance: 46.7 mL/min (by C-G formula based on SCr of 0.94 mg/dL). Liver Function Tests: Recent Labs  Lab 10/11/17 0655  AST 72*  ALT 27  ALKPHOS 56  BILITOT 1.3*  PROT 6.1*  ALBUMIN 2.8*   No results for input(s): LIPASE, AMYLASE in the last 168 hours. No results for input(s): AMMONIA in the last 168 hours. Coagulation Profile: No results for input(s): INR, PROTIME in the last 168 hours. Cardiac Enzymes: Recent Labs  Lab 10/08/17 1921 10/09/17 0328 10/09/17 1031 10/09/17 1542  TROPONINI 0.04* 0.03* 0.03* 0.03*   CBG: Recent Labs  Lab 10/11/17 0804 10/11/17 1213 10/11/17 1618 10/11/17 2120 10/12/17 0749  GLUCAP 104* 118* 208* 80 91   Urine analysis:    Component Value Date/Time   COLORURINE YELLOW 08/19/2014 1854   APPEARANCEUR CLOUDY (A) 08/19/2014 1854   LABSPEC 1.012 08/19/2014 1854   PHURINE 6.0 08/19/2014 1854   GLUCOSEU NEGATIVE 08/19/2014 1854   HGBUR NEGATIVE 08/19/2014 1854   BILIRUBINUR NEGATIVE 08/19/2014 1854   KETONESUR NEGATIVE 08/19/2014 1854   PROTEINUR NEGATIVE 08/19/2014 1854   UROBILINOGEN 0.2 08/19/2014 1854   NITRITE NEGATIVE 08/19/2014 1854   LEUKOCYTESUR NEGATIVE 08/19/2014 1854     Radiology Studies: Reviewed images personally in health database    Scheduled Meds: . amLODipine  5 mg Oral Daily  . cloNIDine  0.1 mg Oral Daily  . doxepin  50 mg Oral QHS  . enoxaparin (LOVENOX) injection  40 mg Subcutaneous Q24H  . escitalopram  10 mg Oral Daily  . famotidine  20 mg Oral BID  . furosemide  80 mg Intravenous BID  . insulin aspart  0-15  Units Subcutaneous TID WC  . insulin aspart  0-5 Units Subcutaneous QHS  . labetalol  100 mg Oral BID  . losartan  50 mg Oral Daily  . mometasone-formoterol  2 puff Inhalation BID  . potassium chloride  40 mEq Oral Daily  . sodium chloride flush  3 mL Intravenous Q12H   Continuous Infusions: . sodium chloride    . sodium chloride Stopped (10/09/17 0234)     LOS: 4 days    Time spent: 5415    Pleas KochJai Akira Perusse, MD [facility](P) [phone]   If 7PM-7AM, please contact night-coverage [email or web address] Password [email or web address] 10/12/2017, 10:57 AM

## 2017-10-13 LAB — BASIC METABOLIC PANEL
Anion gap: 8 (ref 5–15)
BUN: 17 mg/dL (ref 6–20)
CHLORIDE: 106 mmol/L (ref 101–111)
CO2: 28 mmol/L (ref 22–32)
Calcium: 8.7 mg/dL — ABNORMAL LOW (ref 8.9–10.3)
Creatinine, Ser: 0.99 mg/dL (ref 0.44–1.00)
GFR calc non Af Amer: 52 mL/min — ABNORMAL LOW (ref >60)
GFR, EST AFRICAN AMERICAN: 60 mL/min — AB (ref >60)
Glucose, Bld: 98 mg/dL (ref 65–99)
POTASSIUM: 3.9 mmol/L (ref 3.5–5.1)
SODIUM: 142 mmol/L (ref 135–145)

## 2017-10-13 LAB — RENAL FUNCTION PANEL
ALBUMIN: 2.9 g/dL — AB (ref 3.5–5.0)
ANION GAP: 9 (ref 5–15)
BUN: 17 mg/dL (ref 6–20)
CALCIUM: 8.7 mg/dL — AB (ref 8.9–10.3)
CO2: 28 mmol/L (ref 22–32)
Chloride: 105 mmol/L (ref 101–111)
Creatinine, Ser: 0.98 mg/dL (ref 0.44–1.00)
GFR, EST NON AFRICAN AMERICAN: 52 mL/min — AB (ref >60)
Glucose, Bld: 98 mg/dL (ref 65–99)
PHOSPHORUS: 3.8 mg/dL (ref 2.5–4.6)
Potassium: 3.9 mmol/L (ref 3.5–5.1)
SODIUM: 142 mmol/L (ref 135–145)

## 2017-10-13 LAB — GLUCOSE, CAPILLARY
GLUCOSE-CAPILLARY: 84 mg/dL (ref 65–99)
GLUCOSE-CAPILLARY: 118 mg/dL — AB (ref 65–99)
GLUCOSE-CAPILLARY: 112 mg/dL — AB (ref 65–99)
Glucose-Capillary: 108 mg/dL — ABNORMAL HIGH (ref 65–99)

## 2017-10-13 NOTE — Progress Notes (Signed)
Hospitalist progress note   Monique Lewis  ZOX:[id number] DOB: 25-May-1933 DOA: 10/08/2017 PCP: Verlon Au, MD   Specialists:   Brief Narrative:  9 fem Known DM ty ii, gout, COPD, GERD, BIpolar, sinus brady, htn Known diastolic hf--LAst EF in our system EF 65-70% and diastolic HF Last admit 07/2014 with acute hf--dry weight appears ~ 203 to 205 [last OV]   Sees Dr. Desma Maxim of cardiology -last seens 08/28/17  Had ECHO EF60% 05/2017--Imdur added at that time Cardiology consulted earleir Patient stabilizing    Assessment & Plan:   Assessment:  The primary encounter diagnosis was Acute on chronic congestive heart failure, unspecified heart failure type (HCC). Diagnoses of Iron deficiency anemia, unspecified iron deficiency anemia type and Elevated troponin were also pertinent to this visit.  Acute decompensated diastolic hf-ECHo pending-had on last yr as above--subj feels better   Increasing lasix to 60 iv bid -->80 iv bid 3/16 I/o slight neg Re-inforce 1500 cc restriciton TED hose today consulted Dr. Algie Coffer for some HF oversight-appreciate input  DM ty ii-not apparently on any home meds-sugars 80-90's-stop checking sugars  Bipolar-cont lexapro 10 daily, sinequan 50 hs sleep  Htn-BP not controlled yesterday-holding for now losartan-allergic to hydralazine-increase labetalol 100-->200 bid 3/16, cont Norvasc 5, cont Cozaar 50 qd  cl0nidine 0.1 mg.    Sinus brady and episodic vtach-probably has some sick sinus-Hr in low 40 earleir today--will cut back labetalol dosing form 200-->100 and monitor Hr efect  Hypok-replacing with kdur 40--recheck was 3.6 Labs am   DVT prophylaxis: lovenox  Code Status:   full   Family Communication:    none  Disposition Plan:   pending resolution edema and hf   Consultants:   none  Procedures:   Echo 3/15 Study Conclusions  - Left ventricle: The cavity size was normal. There was moderate   concentric hypertrophy. Systolic function  was vigorous. The   estimated ejection fraction was in the range of 65% to 70%. Wall   motion was normal; there were no regional wall motion   abnormalities. Features are consistent with a pseudonormal left   ventricular filling pattern, with concomitant abnormal relaxation   and increased filling pressure (grade 2 diastolic dysfunction). - Aortic valve: Trileaflet; mildly thickened, mildly calcified   leaflets. There was trivial regurgitation. - Mitral valve: Calcified annulus. There was severe regurgitation. - Left atrium: The atrium was severely dilated. - Right atrium: The atrium was severely dilated. - Tricuspid valve: There was severe regurgitation. - Pulmonary arteries: Systolic pressure was moderately increased.   PA peak pressure: 47 mm Hg (S). - Pericardium, extracardiac: A trivial pericardial effusion was   identified posterior to the heart.   Antimicrobials:   none   Subjective:  Sleepy no cp no sob Drinking less  Objective: Vitals:   10/12/17 2232 10/13/17 0500 10/13/17 0836 10/13/17 0917  BP:  (!) 165/52 (!) 179/79   Pulse: (!) 54 (!) 50 (!) 51   Resp:  18    Temp:  (!) 97.5 F (36.4 C)    TempSrc:  Oral    SpO2:  95%  95%  Weight:  90.9 kg (200 lb 8 oz)    Height:        Intake/Output Summary (Last 24 hours) at 10/13/2017 1335 Last data filed at 10/13/2017 1144 Gross per 24 hour  Intake 600 ml  Output 900 ml  Net -300 ml   Filed Weights   10/11/17 0500 10/12/17 0643 10/13/17 0500  Weight: 87.4 kg (192  lb 10.9 oz) 92.1 kg (203 lb) 90.9 kg (200 lb 8 oz)    Examination:  Obese pleasant in nad no distress, no n/v cta b hsm across precordium abd obese nt nd grd 1 LE edema stable, JVd still noted Neuro intact and psych euthymic, no distress  msk intact  Data Reviewed: I have personally reviewed following labs and imaging studies  CBC: Recent Labs  Lab 10/08/17 1921 10/09/17 0328 10/09/17 1031  WBC 10.3 8.9 9.1  NEUTROABS 5.4 4.7  --    HGB 7.9* 7.5* 8.4*  HCT 25.8* 24.6* 27.9*  MCV 81.6 82.6 82.5  PLT 234 226 215   Basic Metabolic Panel: Recent Labs  Lab 10/08/17 1921 10/10/17 0541 10/11/17 0655 10/12/17 0421 10/13/17 0746  NA 138 141 141 142 142  142  K 3.6 3.2* 4.8 3.6 3.9  3.9  CL 98* 101 104 104 106  105  CO2 30 29 27 28 28  28   GLUCOSE 229* 95 124* 97 98  98  BUN 24* 14 16 16 17  17   CREATININE 1.07* 0.87 0.92 0.94 0.99  0.98  CALCIUM 8.6* 8.0* 8.4* 8.6* 8.7*  8.7*  MG  --   --  1.4*  --   --   PHOS  --   --   --   --  3.8   GFR: Estimated Creatinine Clearance: 44.1 mL/min (by C-G formula based on SCr of 0.99 mg/dL). Liver Function Tests: Recent Labs  Lab 10/11/17 0655 10/13/17 0746  AST 72*  --   ALT 27  --   ALKPHOS 56  --   BILITOT 1.3*  --   PROT 6.1*  --   ALBUMIN 2.8* 2.9*   No results for input(s): LIPASE, AMYLASE in the last 168 hours. No results for input(s): AMMONIA in the last 168 hours. Coagulation Profile: No results for input(s): INR, PROTIME in the last 168 hours. Cardiac Enzymes: Recent Labs  Lab 10/08/17 1921 10/09/17 0328 10/09/17 1031 10/09/17 1542  TROPONINI 0.04* 0.03* 0.03* 0.03*   CBG: Recent Labs  Lab 10/12/17 1158 10/12/17 1634 10/12/17 2035 10/13/17 0827 10/13/17 1127  GLUCAP 124* 133* 144* 84 118*   Urine analysis:    Component Value Date/Time   COLORURINE YELLOW 08/19/2014 1854   APPEARANCEUR CLOUDY (A) 08/19/2014 1854   LABSPEC 1.012 08/19/2014 1854   PHURINE 6.0 08/19/2014 1854   GLUCOSEU NEGATIVE 08/19/2014 1854   HGBUR NEGATIVE 08/19/2014 1854   BILIRUBINUR NEGATIVE 08/19/2014 1854   KETONESUR NEGATIVE 08/19/2014 1854   PROTEINUR NEGATIVE 08/19/2014 1854   UROBILINOGEN 0.2 08/19/2014 1854   NITRITE NEGATIVE 08/19/2014 1854   LEUKOCYTESUR NEGATIVE 08/19/2014 1854     Radiology Studies: Reviewed images personally in health database    Scheduled Meds: . amLODipine  5 mg Oral Daily  . cloNIDine  0.1 mg Oral Daily  .  doxepin  50 mg Oral QHS  . enoxaparin (LOVENOX) injection  40 mg Subcutaneous Q24H  . escitalopram  10 mg Oral Daily  . famotidine  20 mg Oral BID  . furosemide  80 mg Intravenous BID  . insulin aspart  0-5 Units Subcutaneous QHS  . labetalol  200 mg Oral BID  . losartan  50 mg Oral Daily  . mometasone-formoterol  2 puff Inhalation BID  . potassium chloride  40 mEq Oral Daily  . sodium chloride flush  3 mL Intravenous Q12H   Continuous Infusions: . sodium chloride    . sodium chloride Stopped (10/09/17 0234)  LOS: 5 days    Time spent: 5015    Pleas KochJai Thomasena Vandenheuvel, MD [facility](P) [phone]   If 7PM-7AM, please contact night-coverage [email or web address] Password [email or web address] 10/13/2017, 1:35 PM

## 2017-10-14 LAB — RENAL FUNCTION PANEL
Albumin: 2.9 g/dL — ABNORMAL LOW (ref 3.5–5.0)
Anion gap: 11 (ref 5–15)
BUN: 18 mg/dL (ref 6–20)
CO2: 25 mmol/L (ref 22–32)
CREATININE: 0.93 mg/dL (ref 0.44–1.00)
Calcium: 8.9 mg/dL (ref 8.9–10.3)
Chloride: 104 mmol/L (ref 101–111)
GFR calc Af Amer: >60 mL/min (ref >60)
GFR calc non Af Amer: 56 mL/min — ABNORMAL LOW (ref >60)
GLUCOSE: 99 mg/dL (ref 65–99)
POTASSIUM: 4.1 mmol/L (ref 3.5–5.1)
Phosphorus: 3.8 mg/dL (ref 2.5–4.6)
Sodium: 140 mmol/L (ref 135–145)

## 2017-10-14 LAB — GLUCOSE, CAPILLARY
GLUCOSE-CAPILLARY: 87 mg/dL (ref 65–99)
GLUCOSE-CAPILLARY: 111 mg/dL — AB (ref 65–99)
Glucose-Capillary: 168 mg/dL — ABNORMAL HIGH (ref 65–99)
Glucose-Capillary: 180 mg/dL — ABNORMAL HIGH (ref 65–99)

## 2017-10-14 LAB — TSH: TSH: 3.269 u[IU]/mL (ref 0.350–4.500)

## 2017-10-14 LAB — T4, FREE: FREE T4: 1.02 ng/dL (ref 0.61–1.12)

## 2017-10-14 MED ORDER — LOSARTAN POTASSIUM 25 MG PO TABS
25.0000 mg | ORAL_TABLET | Freq: Every day | ORAL | Status: DC
  Administered 2017-10-15 – 2017-10-16 (×2): 25 mg via ORAL
  Filled 2017-10-14 (×2): qty 1

## 2017-10-14 NOTE — Progress Notes (Signed)
Physical Therapy Treatment Patient Details Name: Monique Lewis MRN: [id number] DOB: 10/16/1932 Today's Date: 10/14/2017    History of Present Illness Monique Lewis is a 84 y.o. female with medical history significant of HTN, HLD, diastolic CHF last EF noted to be 60-65% in 05/2017, asthma/COPD, DM type II, and GERD; who presents with complaints of progressively worsening shortness of breath and leg swelling.    PT Comments    Progressing well,  Emphasis on mobility up on her feet with cane (which she usually uses).  Not sure if pt will have enough help she can count on over the next few days.    Follow Up Recommendations  Supervision - Intermittent;Home health PT     Equipment Recommendations  None recommended by PT    Recommendations for Other Services       Precautions / Restrictions Precautions Precautions: Fall    Mobility  Bed Mobility               General bed mobility comments: pt is recliner on arrival  Transfers Overall transfer level: Needs assistance   Transfers: Sit to/from Stand Sit to Stand: Min guard         General transfer comment: generally safe technique  Ambulation/Gait Ambulation/Gait assistance: Min guard Ambulation Distance (Feet): 85 Feet Assistive device: Straight cane Gait Pattern/deviations: Step-through pattern     General Gait Details: slower, good use of the cane, mildly dyspneic though vitals good at 95% sats and 48 bpm, up from brady of 45 bpm   Stairs            Wheelchair Mobility    Modified Rankin (Stroke Patients Only)       Balance Overall balance assessment: Needs assistance Sitting-balance support: No upper extremity supported Sitting balance-Leahy Scale: Good       Standing balance-Leahy Scale: Fair                              Cognition Arousal/Alertness: Awake/alert Behavior During Therapy: WFL for tasks assessed/performed Overall Cognitive Status: Within Functional Limits  for tasks assessed                                        Exercises      General Comments        Pertinent Vitals/Pain Pain Assessment: No/denies pain    Home Living                      Prior Function            PT Goals (current goals can now be found in the care plan section) Acute Rehab PT Goals Patient Stated Goal: back home feeling better PT Goal Formulation: With patient Time For Goal Achievement: 10/18/17 Potential to Achieve Goals: Good Progress towards PT goals: Progressing toward goals    Frequency    Min 3X/week      PT Plan Current plan remains appropriate    Co-evaluation              AM-PAC PT "6 Clicks" Daily Activity  Outcome Measure  Difficulty turning over in bed (including adjusting bedclothes, sheets and blankets)?: None Difficulty moving from lying on back to sitting on the side of the bed? : None Difficulty sitting down on and standing up from a chair with arms (e.g.,  wheelchair, bedside commode, etc,.)?: None Help needed moving to and from a bed to chair (including a wheelchair)?: A Little Help needed walking in hospital room?: A Little Help needed climbing 3-5 steps with a railing? : A Little 6 Click Score: 21    End of Session   Activity Tolerance: Patient tolerated treatment well Patient left: in chair;with call bell/phone within reach;with chair alarm set Nurse Communication: Mobility status PT Visit Diagnosis: Other abnormalities of gait and mobility (R26.89)     Time: 1349-1420 PT Time Calculation (min) (ACUTE ONLY): 31 min  Charges:  $Gait Training: 8-22 mins $Therapeutic Activity: 8-22 mins                    G Codes:       10/14/2017  [place] BingKen Devesh Monforte, PT [phone]  (pager)   Eliseo GumKenneth V Erika Slaby 10/14/2017, 4:54 PM

## 2017-10-14 NOTE — Progress Notes (Signed)
Hospitalist progress note   Monique Lewis  WUJ:[id number] DOB: 06/14/1933 DOA: 10/08/2017 PCP: Verlon Au, MD   Specialists:   Brief Narrative:  68 fem Known DM ty ii, gout, COPD, GERD, BIpolar, sinus brady, htn Known diastolic hf--LAst EF in our system EF 65-70% and diastolic HF Last admit 07/2014 with acute hf--dry weight appears ~ 203 to 205 [last OV]   Sees Dr. Desma Maxim of cardiology -last seens 08/28/17  Had ECHO EF60% 05/2017--Imdur added at that time Cardiology consulted earleir Patient stabilizing    Assessment & Plan:   Assessment:  The primary encounter diagnosis was Acute on chronic congestive heart failure, unspecified heart failure type (HCC). Diagnoses of Iron deficiency anemia, unspecified iron deficiency anemia type and Elevated troponin were also pertinent to this visit.  Acute decompensated diastolic hf-ECHo pending-had on last yr as above--subj feels better  Increasing lasix to 60 iv bid -->80 iv bid 3/16 I/o -1.2 l Re-inforce 1500 cc restriciton TED hose today consulted Dr. Algie Coffer for some HF oversight-appreciate input  DM ty ii-not apparently on any home meds-sugars 80-90's-stop checking sugars  Bipolar-cont lexapro 10 daily, sinequan 50 hs sleep  Htn-BP not controlled yesterday-holding for now losartan-allergic to hydralazine-increase labetalol 100-->200 bid 3/16, cont Norvasc 5, cont Cozaar 50 qd  cl0nidine 0.1 mg.    Sinus brady and episodic vtach-probably has some sick sinus-Hr in low 40 earleir today--will cut back labetalol dosing form 200-->100--was held 3/178 As HR in 30-40 and patient sympt, d/c clonidine, amlodipine and keep only losartan 25 [from 50]--Dr. Algie Coffer re-consulted for input  Hypok-replacing with kdur 40--recheck was 3.6 Labs am   DVT prophylaxis: lovenox  Code Status:   full   Family Communication:    none  Disposition Plan:   pending resolution edema and hf   Consultants:   none  Procedures:   Echo 3/15 Study  Conclusions  - Left ventricle: The cavity size was normal. There was moderate   concentric hypertrophy. Systolic function was vigorous. The   estimated ejection fraction was in the range of 65% to 70%. Wall   motion was normal; there were no regional wall motion   abnormalities. Features are consistent with a pseudonormal left   ventricular filling pattern, with concomitant abnormal relaxation   and increased filling pressure (grade 2 diastolic dysfunction). - Aortic valve: Trileaflet; mildly thickened, mildly calcified   leaflets. There was trivial regurgitation. - Mitral valve: Calcified annulus. There was severe regurgitation. - Left atrium: The atrium was severely dilated. - Right atrium: The atrium was severely dilated. - Tricuspid valve: There was severe regurgitation. - Pulmonary arteries: Systolic pressure was moderately increased.   PA peak pressure: 47 mm Hg (S). - Pericardium, extracardiac: A trivial pericardial effusion was   identified posterior to the heart.   Antimicrobials:   none   Subjective:  Some dizzy no cp No fever no n Feels like about to black out No pauses on tele  Objective: Vitals:   10/13/17 1430 10/13/17 2145 10/13/17 2250 10/14/17 0642  BP: (!) 105/49 (!) 174/65  (!) 171/58  Pulse: (!) 48 (!) 50 (!) 52 (!) 53  Resp: 18     Temp: 97.9 F (36.6 C) 98.1 F (36.7 C)  98 F (36.7 C)  TempSrc: Oral Oral  Oral  SpO2: 98% 98%  97%  Weight:    90.3 kg (199 lb)  Height:        Intake/Output Summary (Last 24 hours) at 10/14/2017 1227 Last data filed at 10/14/2017  1018 Gross per 24 hour  Intake 240 ml  Output 1040 ml  Net -800 ml   Filed Weights   10/12/17 0643 10/13/17 0500 10/14/17 0642  Weight: 92.1 kg (203 lb) 90.9 kg (200 lb 8 oz) 90.3 kg (199 lb)    Examination:  Obese pleasant in nad no distress, no n/v cta b hsm across precordium, s1 s2 abd obese nt nd grd 1 LE edema stable, JVd still noted Neuro intact and psych euthymic,  no distress  msk intact  Data Reviewed: I have personally reviewed following labs and imaging studies  CBC: Recent Labs  Lab 10/08/17 1921 10/09/17 0328 10/09/17 1031  WBC 10.3 8.9 9.1  NEUTROABS 5.4 4.7  --   HGB 7.9* 7.5* 8.4*  HCT 25.8* 24.6* 27.9*  MCV 81.6 82.6 82.5  PLT 234 226 215   Basic Metabolic Panel: Recent Labs  Lab 10/10/17 0541 10/11/17 0655 10/12/17 0421 10/13/17 0746 10/14/17 0626  NA 141 141 142 142  142 140  K 3.2* 4.8 3.6 3.9  3.9 4.1  CL 101 104 104 106  105 104  CO2 29 27 28 28  28 25   GLUCOSE 95 124* 97 98  98 99  BUN 14 16 16 17  17 18   CREATININE 0.87 0.92 0.94 0.99  0.98 0.93  CALCIUM 8.0* 8.4* 8.6* 8.7*  8.7* 8.9  MG  --  1.4*  --   --   --   PHOS  --   --   --  3.8 3.8   GFR: Estimated Creatinine Clearance: 46.7 mL/min (by C-G formula based on SCr of 0.93 mg/dL). Liver Function Tests: Recent Labs  Lab 10/11/17 0655 10/13/17 0746 10/14/17 0626  AST 72*  --   --   ALT 27  --   --   ALKPHOS 56  --   --   BILITOT 1.3*  --   --   PROT 6.1*  --   --   ALBUMIN 2.8* 2.9* 2.9*   No results for input(s): LIPASE, AMYLASE in the last 168 hours. No results for input(s): AMMONIA in the last 168 hours. Coagulation Profile: No results for input(s): INR, PROTIME in the last 168 hours. Cardiac Enzymes: Recent Labs  Lab 10/08/17 1921 10/09/17 0328 10/09/17 1031 10/09/17 1542  TROPONINI 0.04* 0.03* 0.03* 0.03*   CBG: Recent Labs  Lab 10/13/17 1127 10/13/17 1649 10/13/17 2143 10/14/17 0755 10/14/17 1108  GLUCAP 118* 112* 108* 87 168*   Urine analysis:    Component Value Date/Time   COLORURINE YELLOW 08/19/2014 1854   APPEARANCEUR CLOUDY (A) 08/19/2014 1854   LABSPEC 1.012 08/19/2014 1854   PHURINE 6.0 08/19/2014 1854   GLUCOSEU NEGATIVE 08/19/2014 1854   HGBUR NEGATIVE 08/19/2014 1854   BILIRUBINUR NEGATIVE 08/19/2014 1854   KETONESUR NEGATIVE 08/19/2014 1854   PROTEINUR NEGATIVE 08/19/2014 1854   UROBILINOGEN 0.2  08/19/2014 1854   NITRITE NEGATIVE 08/19/2014 1854   LEUKOCYTESUR NEGATIVE 08/19/2014 1854     Radiology Studies: Reviewed images personally in health database    Scheduled Meds: . doxepin  50 mg Oral QHS  . enoxaparin (LOVENOX) injection  40 mg Subcutaneous Q24H  . escitalopram  10 mg Oral Daily  . famotidine  20 mg Oral BID  . furosemide  80 mg Intravenous BID  . insulin aspart  0-5 Units Subcutaneous QHS  . labetalol  200 mg Oral BID  . [START ON 10/15/2017] losartan  25 mg Oral Daily  . mometasone-formoterol  2 puff Inhalation  BID  . potassium chloride  40 mEq Oral Daily  . sodium chloride flush  3 mL Intravenous Q12H   Continuous Infusions: . sodium chloride    . sodium chloride Stopped (10/09/17 0234)     LOS: 6 days    Time spent: 6815    Pleas KochJai Min Collymore, MD [facility](P) [phone]   If 7PM-7AM, please contact night-coverage [email or web address] Password [email or web address] 10/14/2017, 12:27 PM

## 2017-10-14 NOTE — Consult Note (Signed)
Ref: Verlon Au, MD   Subjective:  Decreasing shortness of breath and leg edema. Heart rate in 40's, asymptomatic, improves to 50's with 80 feet walk.  Objective:  Vital Signs in the last 24 hours: Temp:  [97.7 F (36.5 C)-98.1 F (36.7 C)] 97.7 F (36.5 C) (03/18 1349) Pulse Rate:  [47-53] 47 (03/18 1349) Cardiac Rhythm: Sinus bradycardia (03/18 0930) Resp:  [18] 18 (03/17 1430) BP: (105-174)/(49-65) 142/54 (03/18 1349) SpO2:  [97 %-98 %] 97 % (03/18 0642) Weight:  [90.3 kg (199 lb)] 90.3 kg (199 lb) (03/18 8119)  Physical Exam: BP Readings from Last 1 Encounters:  10/14/17 (!) 142/54     Wt Readings from Last 1 Encounters:  10/14/17 90.3 kg (199 lb)    Weight change: -0.68 kg (-8 oz) Body mass index is 38.86 kg/m. HEENT: [place]/AT, Eyes-Brown, PERL, EOMI, Conjunctiva-Pale, Sclera-Non-icteric Neck: No JVD, No bruit, Trachea midline. Lungs:  Clear, Bilateral. Cardiac:  Regular rhythm, normal S1 and S2, no S3. II/VI systolic murmur. Abdomen:  Soft, non-tender. BS present. Extremities:  Trace edema present. No cyanosis. No clubbing. CNS: AxOx3, Cranial nerves grossly intact, moves all 4 extremities. Slow gait with cane. Skin: Warm and dry.   Intake/Output from previous day: 03/17 0701 - 03/18 0700 In: 120 [P.O.:120] Out: 840 [Urine:840]    Lab Results: BMET    Component Value Date/Time   NA 140 10/14/2017 0626   NA 142 10/13/2017 0746   NA 142 10/13/2017 0746   K 4.1 10/14/2017 0626   K 3.9 10/13/2017 0746   K 3.9 10/13/2017 0746   CL 104 10/14/2017 0626   CL 106 10/13/2017 0746   CL 105 10/13/2017 0746   CO2 25 10/14/2017 0626   CO2 28 10/13/2017 0746   CO2 28 10/13/2017 0746   GLUCOSE 99 10/14/2017 0626   GLUCOSE 98 10/13/2017 0746   GLUCOSE 98 10/13/2017 0746   BUN 18 10/14/2017 0626   BUN 17 10/13/2017 0746   BUN 17 10/13/2017 0746   CREATININE 0.93 10/14/2017 0626   CREATININE 0.99 10/13/2017 0746   CREATININE 0.98 10/13/2017 0746   CALCIUM 8.9 10/14/2017 0626   CALCIUM 8.7 (L) 10/13/2017 0746   CALCIUM 8.7 (L) 10/13/2017 0746   GFRNONAA 56 (L) 10/14/2017 0626   GFRNONAA 52 (L) 10/13/2017 0746   GFRNONAA 52 (L) 10/13/2017 0746   GFRAA >60 10/14/2017 0626   GFRAA 60 (L) 10/13/2017 0746   GFRAA >60 10/13/2017 0746   CBC    Component Value Date/Time   WBC 9.1 10/09/2017 1031   RBC 3.38 (L) 10/09/2017 1031   HGB 8.4 (L) 10/09/2017 1031   HCT 27.9 (L) 10/09/2017 1031   PLT 215 10/09/2017 1031   MCV 82.5 10/09/2017 1031   MCH 24.9 (L) 10/09/2017 1031   MCHC 30.1 10/09/2017 1031   RDW 17.4 (H) 10/09/2017 1031   LYMPHSABS 3.2 10/09/2017 0328   MONOABS 0.8 10/09/2017 0328   EOSABS 0.2 10/09/2017 0328   BASOSABS 0.0 10/09/2017 0328   HEPATIC Function Panel Recent Labs    10/11/17 0655  PROT 6.1*   HEMOGLOBIN A1C No components found for: HGA1C,  MPG CARDIAC ENZYMES Lab Results  Component Value Date   CKTOTAL 204 (H) 06/12/2012   CKMB 5.4 (H) 06/12/2012   TROPONINI 0.03 (HH) 10/09/2017   TROPONINI 0.03 (HH) 10/09/2017   TROPONINI 0.03 (HH) 10/09/2017   BNP No results for input(s): PROBNP in the last 8760 hours. TSH No results for input(s): TSH in the last 8760  hours. CHOLESTEROL No results for input(s): CHOL in the last 8760 hours.  Scheduled Meds: . doxepin  50 mg Oral QHS  . enoxaparin (LOVENOX) injection  40 mg Subcutaneous Q24H  . escitalopram  10 mg Oral Daily  . famotidine  20 mg Oral BID  . furosemide  80 mg Intravenous BID  . insulin aspart  0-5 Units Subcutaneous QHS  . labetalol  200 mg Oral BID  . [START ON 10/15/2017] losartan  25 mg Oral Daily  . mometasone-formoterol  2 puff Inhalation BID  . potassium chloride  40 mEq Oral Daily  . sodium chloride flush  3 mL Intravenous Q12H   Continuous Infusions: . sodium chloride    . sodium chloride Stopped (10/09/17 0234)   PRN Meds:.sodium chloride, acetaminophen, hydrOXYzine, ipratropium-albuterol, ondansetron (ZOFRAN) IV, sodium  chloride flush  Assessment/Plan: Acute on chronic heart failure with preserved LV systolic function. Obesity Hypertension Type 2 DM COPD Anemia of chronic disease Sinus bradycardia  Agree with decreasing B-blocker dose. Check T3, T4, TSH.    LOS: 6 days    Orpah Cobb  MD  10/14/2017, 1:55 PM

## 2017-10-14 NOTE — Care Management Important Message (Signed)
Important Message  Patient Details  Name: Monique Lewis MRN: [id number] Date of Birth: [birth year]   Medicare Important Message Given:  Yes    [name] [name] 10/14/2017, 1:47 PM

## 2017-10-15 LAB — BASIC METABOLIC PANEL
Anion gap: 10 (ref 5–15)
BUN: 20 mg/dL (ref 6–20)
CHLORIDE: 101 mmol/L (ref 101–111)
CO2: 28 mmol/L (ref 22–32)
CREATININE: 1.03 mg/dL — AB (ref 0.44–1.00)
Calcium: 8.9 mg/dL (ref 8.9–10.3)
GFR, EST AFRICAN AMERICAN: 57 mL/min — AB (ref >60)
GFR, EST NON AFRICAN AMERICAN: 49 mL/min — AB (ref >60)
Glucose, Bld: 114 mg/dL — ABNORMAL HIGH (ref 65–99)
POTASSIUM: 4.5 mmol/L (ref 3.5–5.1)
SODIUM: 139 mmol/L (ref 135–145)

## 2017-10-15 LAB — GLUCOSE, CAPILLARY
GLUCOSE-CAPILLARY: 89 mg/dL (ref 65–99)
GLUCOSE-CAPILLARY: 115 mg/dL — AB (ref 65–99)
GLUCOSE-CAPILLARY: 157 mg/dL — AB (ref 65–99)
Glucose-Capillary: 166 mg/dL — ABNORMAL HIGH (ref 65–99)

## 2017-10-15 LAB — T3, FREE: T3 FREE: 2.6 pg/mL (ref 2.0–4.4)

## 2017-10-15 MED ORDER — AMLODIPINE BESYLATE 5 MG PO TABS
5.0000 mg | ORAL_TABLET | Freq: Every day | ORAL | Status: DC
  Administered 2017-10-15 – 2017-10-18 (×4): 5 mg via ORAL
  Filled 2017-10-15 (×4): qty 1

## 2017-10-15 MED ORDER — FLUTICASONE PROPIONATE 50 MCG/ACT NA SUSP
1.0000 | Freq: Every day | NASAL | Status: DC
  Administered 2017-10-15 – 2017-10-18 (×4): 1 via NASAL
  Filled 2017-10-15: qty 16

## 2017-10-15 NOTE — Consult Note (Signed)
Ref: Verlon AuBoyd, Tammy Lamonica, MD   Subjective:  Improved heart rate off labetalol. Elevated systolic blood pressure.  Objective:  Vital Signs in the last 24 hours: Temp:  [97.7 F (36.5 C)-98.5 F (36.9 C)] 98.5 F (36.9 C) (03/19 0609) Pulse Rate:  [47-61] 61 (03/19 0609) Cardiac Rhythm: Normal sinus rhythm (03/19 0800) Resp:  [20] 20 (03/19 0609) BP: (142-188)/(54-61) 188/61 (03/19 0609) SpO2:  [94 %-98 %] 94 % (03/19 0609) Weight:  [89.9 kg (198 lb 4.8 oz)] 89.9 kg (198 lb 4.8 oz) (03/19 16100609)  Physical Exam: BP Readings from Last 1 Encounters:  10/15/17 (!) 188/61     Wt Readings from Last 1 Encounters:  10/15/17 89.9 kg (198 lb 4.8 oz)    Weight change: -0.318 kg (-11.2 oz) Body mass index is 38.73 kg/m. HEENT: [place]/AT, Eyes-Brown, PERL, EOMI, Conjunctiva-Pale, Sclera-Non-icteric Neck: No JVD, No bruit, Trachea midline. Lungs:  Clear, Bilateral. Cardiac:  Regular rhythm, normal S1 and S2, no S3. II/VI systolic murmur. Abdomen:  Soft, non-tender. BS present. Extremities:  No edema present. No cyanosis. No clubbing. CNS: AxOx3, Cranial nerves grossly intact, moves all 4 extremities.  Skin: Warm and dry.   Intake/Output from previous day: 03/18 0701 - 03/19 0700 In: 940 [P.O.:940] Out: 1700 [Urine:1700]    Lab Results: BMET    Component Value Date/Time   NA 139 10/15/2017 0405   NA 140 10/14/2017 0626   NA 142 10/13/2017 0746   NA 142 10/13/2017 0746   K 4.5 10/15/2017 0405   K 4.1 10/14/2017 0626   K 3.9 10/13/2017 0746   K 3.9 10/13/2017 0746   CL 101 10/15/2017 0405   CL 104 10/14/2017 0626   CL 106 10/13/2017 0746   CL 105 10/13/2017 0746   CO2 28 10/15/2017 0405   CO2 25 10/14/2017 0626   CO2 28 10/13/2017 0746   CO2 28 10/13/2017 0746   GLUCOSE 114 (H) 10/15/2017 0405   GLUCOSE 99 10/14/2017 0626   GLUCOSE 98 10/13/2017 0746   GLUCOSE 98 10/13/2017 0746   BUN 20 10/15/2017 0405   BUN 18 10/14/2017 0626   BUN 17 10/13/2017 0746   BUN 17  10/13/2017 0746   CREATININE 1.03 (H) 10/15/2017 0405   CREATININE 0.93 10/14/2017 0626   CREATININE 0.99 10/13/2017 0746   CREATININE 0.98 10/13/2017 0746   CALCIUM 8.9 10/15/2017 0405   CALCIUM 8.9 10/14/2017 0626   CALCIUM 8.7 (L) 10/13/2017 0746   CALCIUM 8.7 (L) 10/13/2017 0746   GFRNONAA 49 (L) 10/15/2017 0405   GFRNONAA 56 (L) 10/14/2017 0626   GFRNONAA 52 (L) 10/13/2017 0746   GFRNONAA 52 (L) 10/13/2017 0746   GFRAA 57 (L) 10/15/2017 0405   GFRAA >60 10/14/2017 0626   GFRAA 60 (L) 10/13/2017 0746   GFRAA >60 10/13/2017 0746   CBC    Component Value Date/Time   WBC 9.1 10/09/2017 1031   RBC 3.38 (L) 10/09/2017 1031   HGB 8.4 (L) 10/09/2017 1031   HCT 27.9 (L) 10/09/2017 1031   PLT 215 10/09/2017 1031   MCV 82.5 10/09/2017 1031   MCH 24.9 (L) 10/09/2017 1031   MCHC 30.1 10/09/2017 1031   RDW 17.4 (H) 10/09/2017 1031   LYMPHSABS 3.2 10/09/2017 0328   MONOABS 0.8 10/09/2017 0328   EOSABS 0.2 10/09/2017 0328   BASOSABS 0.0 10/09/2017 0328   HEPATIC Function Panel Recent Labs    10/11/17 0655  PROT 6.1*   HEMOGLOBIN A1C No components found for: HGA1C,  MPG CARDIAC ENZYMES  Lab Results  Component Value Date   CKTOTAL 204 (H) 06/12/2012   CKMB 5.4 (H) 06/12/2012   TROPONINI 0.03 (HH) 10/09/2017   TROPONINI 0.03 (HH) 10/09/2017   TROPONINI 0.03 (HH) 10/09/2017   BNP No results for input(s): PROBNP in the last 8760 hours. TSH Recent Labs    10/14/17 1435  TSH 3.269   CHOLESTEROL No results for input(s): CHOL in the last 8760 hours.  Scheduled Meds: . amLODipine  5 mg Oral Daily  . doxepin  50 mg Oral QHS  . enoxaparin (LOVENOX) injection  40 mg Subcutaneous Q24H  . escitalopram  10 mg Oral Daily  . famotidine  20 mg Oral BID  . furosemide  80 mg Intravenous BID  . insulin aspart  0-5 Units Subcutaneous QHS  . losartan  25 mg Oral Daily  . mometasone-formoterol  2 puff Inhalation BID  . potassium chloride  40 mEq Oral Daily  . sodium chloride  flush  3 mL Intravenous Q12H   Continuous Infusions: . sodium chloride    . sodium chloride Stopped (10/09/17 0234)   PRN Meds:.sodium chloride, acetaminophen, hydrOXYzine, ipratropium-albuterol, ondansetron (ZOFRAN) IV, sodium chloride flush  Assessment/Plan: Acute on chronic heart failure with preserved LV systolic function Obesity Type 2 DM Hypertension Anemia of chronic disease Sinus bradycardia - improving  Add amlodipine for hypertension control. Increase activity.    LOS: 7 days    Orpah Cobb  MD  10/15/2017, 10:01 AM

## 2017-10-15 NOTE — Progress Notes (Signed)
Hospitalist progress note   Monique Lewis  MVH:[id number] DOB: [birth year] DOA: 10/08/2017 PCP: Verlon AuBoyd, Tammy Lamonica, MD   Specialists:   Brief Narrative:  [age] fem Known DM ty ii, gout, COPD, GERD, BIpolar, sinus brady, htn Known diastolic hf--LAst EF in our system EF 65-70% and diastolic HF Last admit 07/2014 with acute hf--dry weight appears ~ 203 to 205 [last OV]   Sees Dr. Desma MaximMcGukin of cardiology -last seens 08/28/17  Had ECHO EF60% 05/2017--Imdur added at that time Cardiology consulted earleir Patient stabilizing but still diuresing    Assessment & Plan:   Assessment:  The primary encounter diagnosis was Acute on chronic congestive heart failure, unspecified heart failure type (HCC). Diagnoses of Iron deficiency anemia, unspecified iron deficiency anemia type and Elevated troponin were also pertinent to this visit.  Acute decompensated diastolic hf-ECHo pending-had on last yr as above--subj feels better  Increasing lasix to 60 iv bid -->80 iv bid 3/16--would chang eot oral lasix in 24-48 hours as creat rising slowly--rpt labs am I/o -2.0 Liters so far Re-inforce 1500 cc restriction TED hose today  DM ty ii-not apparently on any home meds-sugars 80-90's-stop checking sugars  Bipolar-cont lexapro 10 daily, sinequan 50 hs sleep  Htn-BP not controlled yesterday-holding for now losartan-allergic to hydralazine- cont Norvasc 5, cont Cozaar 50 qd  cl0nidine 0.1 mg.    Sinus brady and episodic vtach-probably has some sick sinus-Hr in low 40 earleir today--will cut back labetalol dosing form 200-->100--was held 3/18 As HR in 30-40 and patient sympt, d/c clonidine, amlodipine and keep only losartan 25 [from 50]--Dr. Algie Cofferkadakia re-consulted for input--has improve dwith cessation labetalol--not expecting will need ppm this admit  Hypok-replacing with kdur 40--recheck was 4.5 Labs in 1-2 days   DVT prophylaxis: lovenox  Code Status:   full   Family Communication:    none  Disposition Plan:    pending resolution edema and hf and expect closer to end of week would be ready for home  Consultants:   none  Procedures:   Echo 3/15 Study Conclusions  - Left ventricle: The cavity size was normal. There was moderate   concentric hypertrophy. Systolic function was vigorous. The   estimated ejection fraction was in the range of 65% to 70%. Wall   motion was normal; there were no regional wall motion   abnormalities. Features are consistent with a pseudonormal left   ventricular filling pattern, with concomitant abnormal relaxation   and increased filling pressure (grade 2 diastolic dysfunction). - Aortic valve: Trileaflet; mildly thickened, mildly calcified   leaflets. There was trivial regurgitation. - Mitral valve: Calcified annulus. There was severe regurgitation. - Left atrium: The atrium was severely dilated. - Right atrium: The atrium was severely dilated. - Tricuspid valve: There was severe regurgitation. - Pulmonary arteries: Systolic pressure was moderately increased.   PA peak pressure: 47 mm Hg (S). - Pericardium, extracardiac: A trivial pericardial effusion was   identified posterior to the heart.   Antimicrobials:   none   Subjective:  dizzyness improved no cp no cough no fever no chills Seems overall better  Objective: Vitals:   10/14/17 1349 10/14/17 2108 10/14/17 2144 10/15/17 0609  BP: (!) 142/54 (!) 155/60  (!) 188/61  Pulse: (!) 47 (!) 51  61  Resp:    20  Temp: 97.7 F (36.5 C) 98 F (36.7 C)  98.5 F (36.9 C)  TempSrc: Oral Oral  Oral  SpO2:  98% 98% 94%  Weight:    89.9 kg (198 lb  4.8 oz)  Height:        Intake/Output Summary (Last 24 hours) at 10/15/2017 1433 Last data filed at 10/15/2017 0610 Gross per 24 hour  Intake 480 ml  Output 1150 ml  Net -670 ml   Filed Weights   10/13/17 0500 10/14/17 0642 10/15/17 0609  Weight: 90.9 kg (200 lb 8 oz) 90.3 kg (199 lb) 89.9 kg (198 lb 4.8 oz)    Examination:  Obese pleasant in nad no  distress, no n/v cta b hsm across precordium, s1 s2 abd obese nt nd grd 1 LE edema stable, JVD mild Neuro intact and psych euthymic, no distress  msk intact  Data Reviewed: I have personally reviewed following labs and imaging studies  CBC: Recent Labs  Lab 10/08/17 1921 10/09/17 0328 10/09/17 1031  WBC 10.3 8.9 9.1  NEUTROABS 5.4 4.7  --   HGB 7.9* 7.5* 8.4*  HCT 25.8* 24.6* 27.9*  MCV 81.6 82.6 82.5  PLT 234 226 215   Basic Metabolic Panel: Recent Labs  Lab 10/11/17 0655 10/12/17 0421 10/13/17 0746 10/14/17 0626 10/15/17 0405  NA 141 142 142  142 140 139  K 4.8 3.6 3.9  3.9 4.1 4.5  CL 104 104 106  105 104 101  CO2 27 28 28  28 25 28   GLUCOSE 124* 97 98  98 99 114*  BUN 16 16 17  17 18 20   CREATININE 0.92 0.94 0.99  0.98 0.93 1.03*  CALCIUM 8.4* 8.6* 8.7*  8.7* 8.9 8.9  MG 1.4*  --   --   --   --   PHOS  --   --  3.8 3.8  --    GFR: Estimated Creatinine Clearance: 42.1 mL/min (A) (by C-G formula based on SCr of 1.03 mg/dL (H)). Liver Function Tests: Recent Labs  Lab 10/11/17 0655 10/13/17 0746 10/14/17 0626  AST 72*  --   --   ALT 27  --   --   ALKPHOS 56  --   --   BILITOT 1.3*  --   --   PROT 6.1*  --   --   ALBUMIN 2.8* 2.9* 2.9*   No results for input(s): LIPASE, AMYLASE in the last 168 hours. No results for input(s): AMMONIA in the last 168 hours. Coagulation Profile: No results for input(s): INR, PROTIME in the last 168 hours. Cardiac Enzymes: Recent Labs  Lab 10/08/17 1921 10/09/17 0328 10/09/17 1031 10/09/17 1542  TROPONINI 0.04* 0.03* 0.03* 0.03*   CBG: Recent Labs  Lab 10/14/17 1108 10/14/17 1614 10/14/17 2107 10/15/17 0741 10/15/17 1144  GLUCAP 168* 111* 180* 89 115*   Urine analysis:    Component Value Date/Time   COLORURINE YELLOW 08/19/2014 1854   APPEARANCEUR CLOUDY (A) 08/19/2014 1854   LABSPEC 1.012 08/19/2014 1854   PHURINE 6.0 08/19/2014 1854   GLUCOSEU NEGATIVE 08/19/2014 1854   HGBUR NEGATIVE  08/19/2014 1854   BILIRUBINUR NEGATIVE 08/19/2014 1854   KETONESUR NEGATIVE 08/19/2014 1854   PROTEINUR NEGATIVE 08/19/2014 1854   UROBILINOGEN 0.2 08/19/2014 1854   NITRITE NEGATIVE 08/19/2014 1854   LEUKOCYTESUR NEGATIVE 08/19/2014 1854     Radiology Studies: Reviewed images personally in health database    Scheduled Meds: . amLODipine  5 mg Oral Daily  . doxepin  50 mg Oral QHS  . enoxaparin (LOVENOX) injection  40 mg Subcutaneous Q24H  . escitalopram  10 mg Oral Daily  . famotidine  20 mg Oral BID  . fluticasone  1 spray Each Nare  Daily  . furosemide  80 mg Intravenous BID  . insulin aspart  0-5 Units Subcutaneous QHS  . losartan  25 mg Oral Daily  . mometasone-formoterol  2 puff Inhalation BID  . potassium chloride  40 mEq Oral Daily  . sodium chloride flush  3 mL Intravenous Q12H   Continuous Infusions: . sodium chloride    . sodium chloride Stopped (10/09/17 0234)     LOS: 7 days    Time spent: 20    Pleas Koch, MD [facility]   If 7PM-7AM, please contact night-coverage [email or web address] Password [facility] 10/15/2017, 2:33 PM

## 2017-10-16 LAB — RENAL FUNCTION PANEL
ALBUMIN: 3.1 g/dL — AB (ref 3.5–5.0)
Anion gap: 8 (ref 5–15)
BUN: 19 mg/dL (ref 6–20)
CALCIUM: 8.9 mg/dL (ref 8.9–10.3)
CHLORIDE: 101 mmol/L (ref 101–111)
CO2: 29 mmol/L (ref 22–32)
CREATININE: 0.93 mg/dL (ref 0.44–1.00)
GFR, EST NON AFRICAN AMERICAN: 56 mL/min — AB (ref >60)
Glucose, Bld: 130 mg/dL — ABNORMAL HIGH (ref 65–99)
PHOSPHORUS: 3.3 mg/dL (ref 2.5–4.6)
Potassium: 3.8 mmol/L (ref 3.5–5.1)
SODIUM: 138 mmol/L (ref 135–145)

## 2017-10-16 LAB — GLUCOSE, CAPILLARY
GLUCOSE-CAPILLARY: 103 mg/dL — AB (ref 65–99)
GLUCOSE-CAPILLARY: 109 mg/dL — AB (ref 65–99)
GLUCOSE-CAPILLARY: 107 mg/dL — AB (ref 65–99)
Glucose-Capillary: 116 mg/dL — ABNORMAL HIGH (ref 65–99)

## 2017-10-16 MED ORDER — LOSARTAN POTASSIUM 50 MG PO TABS
100.0000 mg | ORAL_TABLET | Freq: Every day | ORAL | Status: DC
  Administered 2017-10-17 – 2017-10-18 (×2): 100 mg via ORAL
  Filled 2017-10-16 (×2): qty 2

## 2017-10-16 MED ORDER — LOSARTAN POTASSIUM 50 MG PO TABS
50.0000 mg | ORAL_TABLET | Freq: Once | ORAL | Status: AC
  Administered 2017-10-16: 50 mg via ORAL
  Filled 2017-10-16: qty 1

## 2017-10-16 NOTE — Consult Note (Signed)
Ref: Verlon AuBoyd, Tammy Lamonica, MD   Subjective:  Improving ambulation. Stable heart rate. Tolerating Amlodipine.   Objective:  Vital Signs in the last 24 hours: Temp:  [97.7 F (36.5 C)-97.9 F (36.6 C)] 97.7 F (36.5 C) (03/20 1413) Pulse Rate:  [61-63] 63 (03/20 1413) Cardiac Rhythm: Normal sinus rhythm (03/20 0917) Resp:  [20] 20 (03/20 0328) BP: (157-193)/(61-81) 157/61 (03/20 1413) SpO2:  [94 %-99 %] 99 % (03/20 1413) Weight:  [89.1 kg (196 lb 8 oz)] 89.1 kg (196 lb 8 oz) (03/20 0328)  Physical Exam: BP Readings from Last 1 Encounters:  10/16/17 (!) 157/61     Wt Readings from Last 1 Encounters:  10/16/17 89.1 kg (196 lb 8 oz)    Weight change: -0.816 kg (-12.8 oz) Body mass index is 38.38 kg/m. HEENT: [place]/AT, Eyes-Brown, PERL, EOMI, Conjunctiva-Pale, Sclera-Non-icteric Neck: No JVD, No bruit, Trachea midline. Lungs:  Clear, Bilateral. Cardiac:  Regular rhythm, normal S1 and S2, no S3. II/VI systolic murmur. Abdomen:  Soft, non-tender. BS present. Extremities:  No edema present. No cyanosis. No clubbing. CNS: AxOx3, Cranial nerves grossly intact, moves all 4 extremities.  Skin: Warm and dry.   Intake/Output from previous day: 03/19 0701 - 03/20 0700 In: 960 [P.O.:960] Out: 1 [Urine:1]    Lab Results: BMET    Component Value Date/Time   NA 138 10/16/2017 0359   NA 139 10/15/2017 0405   NA 140 10/14/2017 0626   K 3.8 10/16/2017 0359   K 4.5 10/15/2017 0405   K 4.1 10/14/2017 0626   CL 101 10/16/2017 0359   CL 101 10/15/2017 0405   CL 104 10/14/2017 0626   CO2 29 10/16/2017 0359   CO2 28 10/15/2017 0405   CO2 25 10/14/2017 0626   GLUCOSE 130 (H) 10/16/2017 0359   GLUCOSE 114 (H) 10/15/2017 0405   GLUCOSE 99 10/14/2017 0626   BUN 19 10/16/2017 0359   BUN 20 10/15/2017 0405   BUN 18 10/14/2017 0626   CREATININE 0.93 10/16/2017 0359   CREATININE 1.03 (H) 10/15/2017 0405   CREATININE 0.93 10/14/2017 0626   CALCIUM 8.9 10/16/2017 0359   CALCIUM 8.9  10/15/2017 0405   CALCIUM 8.9 10/14/2017 0626   GFRNONAA 56 (L) 10/16/2017 0359   GFRNONAA 49 (L) 10/15/2017 0405   GFRNONAA 56 (L) 10/14/2017 0626   GFRAA >60 10/16/2017 0359   GFRAA 57 (L) 10/15/2017 0405   GFRAA >60 10/14/2017 0626   CBC    Component Value Date/Time   WBC 9.1 10/09/2017 1031   RBC 3.38 (L) 10/09/2017 1031   HGB 8.4 (L) 10/09/2017 1031   HCT 27.9 (L) 10/09/2017 1031   PLT 215 10/09/2017 1031   MCV 82.5 10/09/2017 1031   MCH 24.9 (L) 10/09/2017 1031   MCHC 30.1 10/09/2017 1031   RDW 17.4 (H) 10/09/2017 1031   LYMPHSABS 3.2 10/09/2017 0328   MONOABS 0.8 10/09/2017 0328   EOSABS 0.2 10/09/2017 0328   BASOSABS 0.0 10/09/2017 0328   HEPATIC Function Panel Recent Labs    10/11/17 0655  PROT 6.1*   HEMOGLOBIN A1C No components found for: HGA1C,  MPG CARDIAC ENZYMES Lab Results  Component Value Date   CKTOTAL 204 (H) 06/12/2012   CKMB 5.4 (H) 06/12/2012   TROPONINI 0.03 (HH) 10/09/2017   TROPONINI 0.03 (HH) 10/09/2017   TROPONINI 0.03 (HH) 10/09/2017   BNP No results for input(s): PROBNP in the last 8760 hours. TSH Recent Labs    10/14/17 1435  TSH 3.269   CHOLESTEROL No  results for input(s): CHOL in the last 8760 hours.  Scheduled Meds: . amLODipine  5 mg Oral Daily  . doxepin  50 mg Oral QHS  . enoxaparin (LOVENOX) injection  40 mg Subcutaneous Q24H  . escitalopram  10 mg Oral Daily  . famotidine  20 mg Oral BID  . fluticasone  1 spray Each Nare Daily  . furosemide  80 mg Intravenous BID  . insulin aspart  0-5 Units Subcutaneous QHS  . [START ON 10/17/2017] losartan  100 mg Oral Daily  . mometasone-formoterol  2 puff Inhalation BID  . potassium chloride  40 mEq Oral Daily  . sodium chloride flush  3 mL Intravenous Q12H   Continuous Infusions: . sodium chloride    . sodium chloride Stopped (10/09/17 0234)   PRN Meds:.sodium chloride, acetaminophen, hydrOXYzine, ipratropium-albuterol, ondansetron (ZOFRAN) IV, sodium chloride  flush  Assessment/Plan: Acute on chronic diastolic left heart failure Obesity Type 2 DM Hypertension Anemia of chronic disease Symptomatic Sinus bradycardia-resolved  Increase activity. F/U as needed.   LOS: 8 days    Orpah Cobb  MD  10/16/2017, 8:14 PM

## 2017-10-16 NOTE — Progress Notes (Signed)
Physical Therapy Treatment Patient Details Name: Monique Lewis MRN: [id number] DOB: 28-Mar-1933 Today's Date: 10/16/2017    History of Present Illness Monique Lewis is a 84 y.o. female with medical history significant of HTN, HLD, diastolic CHF last EF noted to be 60-65% in 05/2017, asthma/COPD, DM type II, and GERD; who presents with complaints of progressively worsening shortness of breath and leg swelling.    PT Comments    Notable improvement from a cardiopulmonary standpoint with EHR rising appropriately into the 70's with minimal exertion and a corresponding drop in exertion and less dyspnea during gait.   Follow Up Recommendations  Supervision - Intermittent;Home health PT     Equipment Recommendations  None recommended by PT    Recommendations for Other Services       Precautions / Restrictions      Mobility  Bed Mobility Overal bed mobility: Needs Assistance Bed Mobility: Supine to Sit;Sit to Supine     Supine to sit: Supervision Sit to supine: Supervision      Transfers Overall transfer level: Needs assistance Equipment used: Rolling walker (2 wheeled) Transfers: Sit to/from Stand Sit to Stand: Supervision         General transfer comment: safe technique  Ambulation/Gait Ambulation/Gait assistance: Supervision Ambulation Distance (Feet): 250 Feet Assistive device: Rolling walker (2 wheeled) Gait Pattern/deviations: Step-through pattern   Gait velocity interpretation: at or above normal speed for age/gender General Gait Details: able to vary speed appropriately, steady with RW during scanning and turns   Stairs            Wheelchair Mobility    Modified Rankin (Stroke Patients Only)       Balance     Sitting balance-Leahy Scale: Good       Standing balance-Leahy Scale: Fair                              Cognition Arousal/Alertness: Awake/alert Behavior During Therapy: WFL for tasks assessed/performed Overall  Cognitive Status: Within Functional Limits for tasks assessed                                        Exercises      General Comments General comments (skin integrity, edema, etc.): Sats on RA during gait between 90 and 92%, with EHR improvements into the 70's to 75 bpm.  pt not as overtly dyspneic      Pertinent Vitals/Pain Pain Assessment: No/denies pain    Home Living                      Prior Function            PT Goals (current goals can now be found in the care plan section) Acute Rehab PT Goals Patient Stated Goal: back home feeling better PT Goal Formulation: With patient Time For Goal Achievement: 10/18/17 Potential to Achieve Goals: Good Progress towards PT goals: Progressing toward goals    Frequency    Min 3X/week      PT Plan Current plan remains appropriate    Co-evaluation              AM-PAC PT "6 Clicks" Daily Activity  Outcome Measure  Difficulty turning over in bed (including adjusting bedclothes, sheets and blankets)?: None Difficulty moving from lying on back to sitting on the side of  the bed? : None Difficulty sitting down on and standing up from a chair with arms (e.g., wheelchair, bedside commode, etc,.)?: None Help needed moving to and from a bed to chair (including a wheelchair)?: A Little Help needed walking in hospital room?: A Little Help needed climbing 3-5 steps with a railing? : A Little 6 Click Score: 21    End of Session   Activity Tolerance: Patient tolerated treatment well Patient left: in bed;with call bell/phone within reach Nurse Communication: Mobility status PT Visit Diagnosis: Other abnormalities of gait and mobility (R26.89)     Time: 1730-1747 PT Time Calculation (min) (ACUTE ONLY): 17 min  Charges:  $Gait Training: 8-22 mins                    G Codes:       10/16/2017  Wade Hampton BingKen Arletha Marschke, PT [phone]  (pager)   Eliseo GumKenneth V Ceejay Kegley 10/16/2017, 5:53  PM

## 2017-10-16 NOTE — Progress Notes (Signed)
PROGRESS NOTE    Monique Lewis  ZOX:[id number] DOB: 1933-06-10 DOA: 10/08/2017 PCP: Monique Au, MD   Brief Narrative: Monique Lewis is a 84 y.o. female with a history of diabetes mellitus type 2, gout, COPD, GERD, bipolar disorder, sinus brady, hypertension, heart failure.   Assessment & Plan:   Principal Problem:   Acute on chronic diastolic CHF (congestive heart failure) (HCC) Active Problems:   Anemia   DM2 (diabetes mellitus, type 2) (HCC)   Acute on chronic diastolic heart failure Appears euvolemic on exam. Below dry weight with a current weight of 196 lbs. Resolved. -Switch to home Demadex this evening -Daily weight/strict in and out  Hypertensive urgency Secondary to discontinuation of chronic home medications vs rebound from cessation from clonidine vs combination -Increase losartan to 100 mg daily (give an extra dose of 50 mg today) -Continue amlodipine -Discontinue labetalol/Coreg on discharge secondary to significant bradycardia -Continue to hold clonidine  Diabetes mellitus, type 2 Well controlled. Last hemoglobin A1C from 2016 (7.0%) -Continue SSI  Anemia Stable. S/p 1 unit of PRBC on 10/09/17. Unknown etiology.  Sinus bradycardia Improved with cessation of beta blocker  Hypokalemia Secondary to diuresis. Resolved with repletion.   DVT prophylaxis: Lovenox Code Status:   Code Status: Full Code Family Communication: None at bedside Disposition Plan: Discharge likely in 24 hours one blood pressure better controlled.   Consultants:   Cardiology  Procedures:   PRBC, 1 unit (10/09/17)  Transthoracic Echocardiogram (3/15) Study Conclusions  - Left ventricle: The cavity size was normal. There was moderate concentric hypertrophy. Systolic function was vigorous. The estimated ejection fraction was in the range of 65% to 70%. Wall motion was normal; there were no regional wall motion abnormalities. Features are consistent with  a pseudonormal left ventricular filling pattern, with concomitant abnormal relaxation and increased filling pressure (grade 2 diastolic dysfunction). - Aortic valve: Trileaflet; mildly thickened, mildly calcified leaflets. There was trivial regurgitation. - Mitral valve: Calcified annulus. There was severe regurgitation. - Left atrium: The atrium was severely dilated. - Right atrium: The atrium was severely dilated. - Tricuspid valve: There was severe regurgitation. - Pulmonary arteries: Systolic pressure was moderately increased. PA peak pressure: 47 mm Hg (S). - Pericardium, extracardiac: A trivial pericardial effusion was identified posterior to the heart.     Antimicrobials:  None    Subjective: No issues overnight.  Objective: Vitals:   10/15/17 2001 10/16/17 0328 10/16/17 0917 10/16/17 1127  BP: (!) 167/64 (!) 193/81 (!) 185/69   Pulse: (!) 53 61    Resp:  20    Temp: 98.7 F (37.1 C) 97.9 F (36.6 C)    TempSrc: Oral Oral    SpO2: 93% 94%  95%  Weight:  89.1 kg (196 lb 8 oz)    Height:        Intake/Output Summary (Last 24 hours) at 10/16/2017 1338 Last data filed at 10/16/2017 0332 Gross per 24 hour  Intake 480 ml  Output 1 ml  Net 479 ml   Filed Weights   10/14/17 0642 10/15/17 0609 10/16/17 0328  Weight: 90.3 kg (199 lb) 89.9 kg (198 lb 4.8 oz) 89.1 kg (196 lb 8 oz)    Examination:  General exam: Appears calm and comfortable Respiratory system: Clear to auscultation. Respiratory effort normal. Cardiovascular system: S1 & S2 heard, RRR. No murmurs, rubs, gallops or clicks. Gastrointestinal system: Abdomen is nondistended, soft and nontender. No organomegaly or masses felt. Normal bowel sounds heard. Central nervous system: Alert and  oriented. No focal neurological deficits. Extremities: Mild trace to 1+ LE edema. No calf tenderness Skin: No cyanosis. No rashes Psychiatry: Judgement and insight appear normal. Mood & affect appropriate.      Data Reviewed: I have personally reviewed following labs and imaging studies  CBC: No results for input(s): WBC, NEUTROABS, HGB, HCT, MCV, PLT in the last 168 hours. Basic Metabolic Panel: Recent Labs  Lab 10/11/17 0655 10/12/17 0421 10/13/17 0746 10/14/17 0626 10/15/17 0405 10/16/17 0359  NA 141 142 142  142 140 139 138  K 4.8 3.6 3.9  3.9 4.1 4.5 3.8  CL 104 104 106  105 104 101 101  CO2 27 28 28  28 25 28 29   GLUCOSE 124* 97 98  98 99 114* 130*  BUN 16 16 17  17 18 20 19   CREATININE 0.92 0.94 0.99  0.98 0.93 1.03* 0.93  CALCIUM 8.4* 8.6* 8.7*  8.7* 8.9 8.9 8.9  MG 1.4*  --   --   --   --   --   PHOS  --   --  3.8 3.8  --  3.3   GFR: Estimated Creatinine Clearance: 46.3 mL/min (by C-G formula based on SCr of 0.93 mg/dL). Liver Function Tests: Recent Labs  Lab 10/11/17 0655 10/13/17 0746 10/14/17 0626 10/16/17 0359  AST 72*  --   --   --   ALT 27  --   --   --   ALKPHOS 56  --   --   --   BILITOT 1.3*  --   --   --   PROT 6.1*  --   --   --   ALBUMIN 2.8* 2.9* 2.9* 3.1*   No results for input(s): LIPASE, AMYLASE in the last 168 hours. No results for input(s): AMMONIA in the last 168 hours. Coagulation Profile: No results for input(s): INR, PROTIME in the last 168 hours. Cardiac Enzymes: Recent Labs  Lab 10/09/17 1542  TROPONINI 0.03*   BNP (last 3 results) No results for input(s): PROBNP in the last 8760 hours. HbA1C: No results for input(s): HGBA1C in the last 72 hours. CBG: Recent Labs  Lab 10/15/17 1144 10/15/17 1629 10/15/17 2000 10/16/17 0759 10/16/17 1148  GLUCAP 115* 166* 157* 103* 109*   Lipid Profile: No results for input(s): CHOL, HDL, LDLCALC, TRIG, CHOLHDL, LDLDIRECT in the last 72 hours. Thyroid Function Tests: Recent Labs    10/14/17 1435  TSH 3.269  FREET4 1.02  T3FREE 2.6   Anemia Panel: No results for input(s): VITAMINB12, FOLATE, FERRITIN, TIBC, IRON, RETICCTPCT in the last 72 hours. Sepsis Labs: No  results for input(s): PROCALCITON, LATICACIDVEN in the last 168 hours.  No results found for this or any previous visit (from the past 240 hour(s)).       Radiology Studies: No results found.      Scheduled Meds: . amLODipine  5 mg Oral Daily  . doxepin  50 mg Oral QHS  . enoxaparin (LOVENOX) injection  40 mg Subcutaneous Q24H  . escitalopram  10 mg Oral Daily  . famotidine  20 mg Oral BID  . fluticasone  1 spray Each Nare Daily  . furosemide  80 mg Intravenous BID  . insulin aspart  0-5 Units Subcutaneous QHS  . losartan  25 mg Oral Daily  . mometasone-formoterol  2 puff Inhalation BID  . potassium chloride  40 mEq Oral Daily  . sodium chloride flush  3 mL Intravenous Q12H   Continuous Infusions: . sodium  chloride    . sodium chloride Stopped (10/09/17 0234)     LOS: 8 days     Jacquelin Hawkingalph Lilyanna Lunt, MD [facility] 10/16/2017, 1:38 PM Pager: [phone]  If 7PM-7AM, please contact night-coverage [email or web address] Password [email or web address] 10/16/2017, 1:38 PM

## 2017-10-17 DIAGNOSIS — R001 Bradycardia, unspecified: Secondary | ICD-10-CM

## 2017-10-17 DIAGNOSIS — R748 Abnormal levels of other serum enzymes: Secondary | ICD-10-CM

## 2017-10-17 DIAGNOSIS — I160 Hypertensive urgency: Secondary | ICD-10-CM

## 2017-10-17 DIAGNOSIS — D649 Anemia, unspecified: Secondary | ICD-10-CM

## 2017-10-17 DIAGNOSIS — I5033 Acute on chronic diastolic (congestive) heart failure: Secondary | ICD-10-CM

## 2017-10-17 DIAGNOSIS — E118 Type 2 diabetes mellitus with unspecified complications: Secondary | ICD-10-CM

## 2017-10-17 LAB — GLUCOSE, CAPILLARY
GLUCOSE-CAPILLARY: 111 mg/dL — AB (ref 65–99)
GLUCOSE-CAPILLARY: 144 mg/dL — AB (ref 65–99)
GLUCOSE-CAPILLARY: 126 mg/dL — AB (ref 65–99)
GLUCOSE-CAPILLARY: 174 mg/dL — AB (ref 65–99)

## 2017-10-17 MED ORDER — TORSEMIDE 20 MG PO TABS
20.0000 mg | ORAL_TABLET | Freq: Two times a day (BID) | ORAL | Status: DC
  Administered 2017-10-17 – 2017-10-18 (×3): 20 mg via ORAL
  Filled 2017-10-17 (×3): qty 1

## 2017-10-17 MED ORDER — CLONIDINE HCL 0.2 MG PO TABS
0.2000 mg | ORAL_TABLET | Freq: Once | ORAL | Status: AC
  Administered 2017-10-17: 0.2 mg via ORAL
  Filled 2017-10-17: qty 1

## 2017-10-17 MED ORDER — CLONIDINE HCL 0.1 MG PO TABS
0.1000 mg | ORAL_TABLET | Freq: Every day | ORAL | Status: DC
  Administered 2017-10-18: 0.1 mg via ORAL
  Filled 2017-10-17: qty 1

## 2017-10-17 NOTE — Care Management Note (Signed)
Case Management Note  Patient Details  Name: Monique Lewis MRN: [id number] Date of Birth: Dec 11, 1932  Subjective/Objective:  Pt presented for CHF and Hypertension Urgency. Pt continues on IV Lasix. PTA From home alone and has support of daughter. Pt gets an aide 3 hours day 6 days a week [facility]. Pt has DME cane and rw. Transortation via [employer] to MD appointments and pharmacy. [facility] the patient at this time.                    Action/Plan: CM did speak with patient and [facility] hours of [facility]/c. Pt will need HH RN/ PT orders and F2F prior to d/c. No further needs from CM at this time.   Expected Discharge Date:                  Expected Discharge Plan:  Home w Home Health Services  In-House Referral:  NA  Discharge planning Services  CM Consult  Post Acute Care Choice:  Home Health Choice offered to:  Patient  DME Arranged:  N/A DME Agency:  NA  HH Arranged:  RN, PT HH Agency:  [facility]  Status of Service:  Completed, signed off  If discussed at Long Length of Stay Meetings, dates discussed:    Additional Comments:  Gala LewandowskyGraves-Bigelow, Capucine Tryon Kaye, RN 10/17/2017, 3:30 PM

## 2017-10-17 NOTE — Progress Notes (Signed)
PROGRESS NOTE    Monique Lewis  UEA:[id number] DOB: 01-16-1933 DOA: 10/08/2017 PCP: Verlon Au, MD   Brief Narrative: Monique Lewis is a 84 y.o. female with a history of diabetes mellitus type 2, gout, COPD, GERD, bipolar disorder, sinus brady, hypertension, heart failure.   Assessment & Plan:   Principal Problem:   Acute on chronic diastolic CHF (congestive heart failure) (HCC) Active Problems:   Anemia   DM2 (diabetes mellitus, type 2) (HCC)   Acute on chronic diastolic heart failure Appears euvolemic on exam. Below dry weight with a current weight of 196 lbs on 3/20 but now with a weight of 212 lbs. Appears stable from yesterday. -Demadex 20 mg BID -Daily weight/strict in and out -Recheck weight  Hypertensive urgency Secondary to discontinuation of chronic home medications vs rebound from cessation from clonidine vs combination. Worsened overnight. -Continue losartan to 100 mg daily -Continue home Clonidine 0.1 mg daily -Continue amlodipine -Discontinue labetalol/Coreg on discharge secondary to significant bradycardia  Diabetes mellitus, type 2 Well controlled. Last hemoglobin A1C from 2016 (7.0%) -Continue SSI  Anemia Stable. S/p 1 unit of PRBC on 10/09/17. Unknown etiology.  Sinus bradycardia Improved with cessation of beta blocker  Hypokalemia Secondary to diuresis. Resolved with repletion.   DVT prophylaxis: Lovenox Code Status:   Code Status: Full Code Family Communication: None at bedside Disposition Plan: Discharge home today if blood pressure better controlled, otherwise, another 24 hours   Consultants:   Cardiology  Procedures:   PRBC, 1 unit (10/09/17)  Transthoracic Echocardiogram (3/15) Study Conclusions  - Left ventricle: The cavity size was normal. There was moderate concentric hypertrophy. Systolic function was vigorous. The estimated ejection fraction was in the range of 65% to 70%. Wall motion was normal; there  were no regional wall motion abnormalities. Features are consistent with a pseudonormal left ventricular filling pattern, with concomitant abnormal relaxation and increased filling pressure (grade 2 diastolic dysfunction). - Aortic valve: Trileaflet; mildly thickened, mildly calcified leaflets. There was trivial regurgitation. - Mitral valve: Calcified annulus. There was severe regurgitation. - Left atrium: The atrium was severely dilated. - Right atrium: The atrium was severely dilated. - Tricuspid valve: There was severe regurgitation. - Pulmonary arteries: Systolic pressure was moderately increased. PA peak pressure: 47 mm Hg (S). - Pericardium, extracardiac: A trivial pericardial effusion was identified posterior to the heart.     Antimicrobials:  None    Subjective: Hypertensive overnight. Mild headache.  Objective: Vitals:   10/17/17 0535 10/17/17 0555 10/17/17 0800 10/17/17 0959  BP: (!) 205/67 (!) 167/64 (!) 184/72 (!) 156/58  Pulse:      Resp:      Temp:      TempSrc:      SpO2:      Weight:      Height:        Intake/Output Summary (Last 24 hours) at 10/17/2017 1023 Last data filed at 10/17/2017 0425 Gross per 24 hour  Intake 480 ml  Output 700 ml  Net -220 ml   Filed Weights   10/15/17 0609 10/16/17 0328 10/17/17 0425  Weight: 89.9 kg (198 lb 4.8 oz) 89.1 kg (196 lb 8 oz) 96.4 kg (212 lb 8.4 oz)    Examination:  General exam: Appears calm and comfortable Respiratory system: Clear to auscultation. Respiratory effort normal. Cardiovascular system: S1 & S2 heard, RRR. 2/6 systolic murmur, rubs, gallops or clicks. Gastrointestinal system: Abdomen is nondistended, soft and nontender. No organomegaly or masses felt. Normal bowel sounds heard. Central  nervous system: Alert and oriented. No focal neurological deficits. Extremities: Mild trace to 1+ LE edema. No calf tenderness Skin: No cyanosis. No rashes Psychiatry: Judgement and insight  appear normal. Mood & affect appropriate.     Data Reviewed: I have personally reviewed following labs and imaging studies  CBC: No results for input(s): WBC, NEUTROABS, HGB, HCT, MCV, PLT in the last 168 hours. Basic Metabolic Panel: Recent Labs  Lab 10/11/17 0655 10/12/17 0421 10/13/17 0746 10/14/17 0626 10/15/17 0405 10/16/17 0359  NA 141 142 142  142 140 139 138  K 4.8 3.6 3.9  3.9 4.1 4.5 3.8  CL 104 104 106  105 104 101 101  CO2 27 28 28  28 25 28 29   GLUCOSE 124* 97 98  98 99 114* 130*  BUN 16 16 17  17 18 20 19   CREATININE 0.92 0.94 0.99  0.98 0.93 1.03* 0.93  CALCIUM 8.4* 8.6* 8.7*  8.7* 8.9 8.9 8.9  MG 1.4*  --   --   --   --   --   PHOS  --   --  3.8 3.8  --  3.3   GFR: Estimated Creatinine Clearance: 48.5 mL/min (by C-G formula based on SCr of 0.93 mg/dL). Liver Function Tests: Recent Labs  Lab 10/11/17 0655 10/13/17 0746 10/14/17 0626 10/16/17 0359  AST 72*  --   --   --   ALT 27  --   --   --   ALKPHOS 56  --   --   --   BILITOT 1.3*  --   --   --   PROT 6.1*  --   --   --   ALBUMIN 2.8* 2.9* 2.9* 3.1*   No results for input(s): LIPASE, AMYLASE in the last 168 hours. No results for input(s): AMMONIA in the last 168 hours. Coagulation Profile: No results for input(s): INR, PROTIME in the last 168 hours. Cardiac Enzymes: No results for input(s): CKTOTAL, CKMB, CKMBINDEX, TROPONINI in the last 168 hours. BNP (last 3 results) No results for input(s): PROBNP in the last 8760 hours. HbA1C: No results for input(s): HGBA1C in the last 72 hours. CBG: Recent Labs  Lab 10/16/17 0759 10/16/17 1148 10/16/17 1703 10/16/17 2047 10/17/17 0752  GLUCAP 103* 109* 116* 107* 111*   Lipid Profile: No results for input(s): CHOL, HDL, LDLCALC, TRIG, CHOLHDL, LDLDIRECT in the last 72 hours. Thyroid Function Tests: Recent Labs    10/14/17 1435  TSH 3.269  FREET4 1.02  T3FREE 2.6   Anemia Panel: No results for input(s): VITAMINB12, FOLATE,  FERRITIN, TIBC, IRON, RETICCTPCT in the last 72 hours. Sepsis Labs: No results for input(s): PROCALCITON, LATICACIDVEN in the last 168 hours.  No results found for this or any previous visit (from the past 240 hour(s)).       Radiology Studies: No results found.      Scheduled Meds: . amLODipine  5 mg Oral Daily  . [START ON 10/18/2017] cloNIDine  0.1 mg Oral Daily  . doxepin  50 mg Oral QHS  . enoxaparin (LOVENOX) injection  40 mg Subcutaneous Q24H  . escitalopram  10 mg Oral Daily  . famotidine  20 mg Oral BID  . fluticasone  1 spray Each Nare Daily  . insulin aspart  0-5 Units Subcutaneous QHS  . losartan  100 mg Oral Daily  . mometasone-formoterol  2 puff Inhalation BID  . potassium chloride  40 mEq Oral Daily  . sodium chloride flush  3  mL Intravenous Q12H  . torsemide  20 mg Oral BID   Continuous Infusions: . sodium chloride       LOS: 9 days     Jacquelin Hawkingalph Tanielle Emigh, MD [facility] 10/17/2017, 10:23 AM Pager: [phone]  If 7PM-7AM, please contact night-coverage [email or web address] Password [email or web address] 10/17/2017, 10:23 AM

## 2017-10-17 NOTE — Consult Note (Signed)
Ref: Verlon AuBoyd, Tammy Lamonica, MD   Subjective:  Elevated blood pressure and headache earlier. Now has improved blood pressure control.  Objective:  Vital Signs in the last 24 hours: Temp:  [98.4 F (36.9 C)-98.6 F (37 C)] 98.5 F (36.9 C) (03/21 2129) Pulse Rate:  [62-64] 62 (03/21 2129) Cardiac Rhythm: Normal sinus rhythm (03/21 2047) Resp:  [20] 20 (03/21 2129) BP: (145-217)/(58-82) 145/66 (03/21 1600) SpO2:  [95 %-100 %] 100 % (03/21 2129) Weight:  [87.7 kg (193 lb 4.8 oz)-96.4 kg (212 lb 8.4 oz)] 87.7 kg (193 lb 4.8 oz) (03/21 1600)  Physical Exam: BP Readings from Last 1 Encounters:  10/17/17 (!) 145/66     Wt Readings from Last 1 Encounters:  10/17/17 87.7 kg (193 lb 4.8 oz)    Weight change: 7.268 kg (16 lb 0.4 oz) Body mass index is 37.75 kg/m. HEENT: [place]/AT, Eyes-Brown, PERL, EOMI, Conjunctiva-Pale Sclera-Non-icteric Neck: No JVD, No bruit, Trachea midline. Lungs:  Clear, Bilateral. Cardiac:  Regular rhythm, normal S1 and S2, no S3. II/VI systolic murmur. Abdomen:  Soft, non-tender. BS present. Extremities:  No edema present. No cyanosis. No clubbing. CNS: AxOx3, Cranial nerves grossly intact, moves all 4 extremities.  Skin: Warm and dry.   Intake/Output from previous day: 03/20 0701 - 03/21 0700 In: 720 [P.O.:720] Out: 700 [Urine:700]    Lab Results: BMET    Component Value Date/Time   NA 138 10/16/2017 0359   NA 139 10/15/2017 0405   NA 140 10/14/2017 0626   K 3.8 10/16/2017 0359   K 4.5 10/15/2017 0405   K 4.1 10/14/2017 0626   CL 101 10/16/2017 0359   CL 101 10/15/2017 0405   CL 104 10/14/2017 0626   CO2 29 10/16/2017 0359   CO2 28 10/15/2017 0405   CO2 25 10/14/2017 0626   GLUCOSE 130 (H) 10/16/2017 0359   GLUCOSE 114 (H) 10/15/2017 0405   GLUCOSE 99 10/14/2017 0626   BUN 19 10/16/2017 0359   BUN 20 10/15/2017 0405   BUN 18 10/14/2017 0626   CREATININE 0.93 10/16/2017 0359   CREATININE 1.03 (H) 10/15/2017 0405   CREATININE 0.93 10/14/2017  0626   CALCIUM 8.9 10/16/2017 0359   CALCIUM 8.9 10/15/2017 0405   CALCIUM 8.9 10/14/2017 0626   GFRNONAA 56 (L) 10/16/2017 0359   GFRNONAA 49 (L) 10/15/2017 0405   GFRNONAA 56 (L) 10/14/2017 0626   GFRAA >60 10/16/2017 0359   GFRAA 57 (L) 10/15/2017 0405   GFRAA >60 10/14/2017 0626   CBC    Component Value Date/Time   WBC 9.1 10/09/2017 1031   RBC 3.38 (L) 10/09/2017 1031   HGB 8.4 (L) 10/09/2017 1031   HCT 27.9 (L) 10/09/2017 1031   PLT 215 10/09/2017 1031   MCV 82.5 10/09/2017 1031   MCH 24.9 (L) 10/09/2017 1031   MCHC 30.1 10/09/2017 1031   RDW 17.4 (H) 10/09/2017 1031   LYMPHSABS 3.2 10/09/2017 0328   MONOABS 0.8 10/09/2017 0328   EOSABS 0.2 10/09/2017 0328   BASOSABS 0.0 10/09/2017 0328   HEPATIC Function Panel Recent Labs    10/11/17 0655  PROT 6.1*   HEMOGLOBIN A1C No components found for: HGA1C,  MPG CARDIAC ENZYMES Lab Results  Component Value Date   CKTOTAL 204 (H) 06/12/2012   CKMB 5.4 (H) 06/12/2012   TROPONINI 0.03 (HH) 10/09/2017   TROPONINI 0.03 (HH) 10/09/2017   TROPONINI 0.03 (HH) 10/09/2017   BNP No results for input(s): PROBNP in the last 8760 hours. TSH Recent Labs  10/14/17 1435  TSH 3.269   CHOLESTEROL No results for input(s): CHOL in the last 8760 hours.  Scheduled Meds: . amLODipine  5 mg Oral Daily  . [START ON 10/18/2017] cloNIDine  0.1 mg Oral Daily  . doxepin  50 mg Oral QHS  . enoxaparin (LOVENOX) injection  40 mg Subcutaneous Q24H  . escitalopram  10 mg Oral Daily  . famotidine  20 mg Oral BID  . fluticasone  1 spray Each Nare Daily  . insulin aspart  0-5 Units Subcutaneous QHS  . losartan  100 mg Oral Daily  . mometasone-formoterol  2 puff Inhalation BID  . potassium chloride  40 mEq Oral Daily  . sodium chloride flush  3 mL Intravenous Q12H  . torsemide  20 mg Oral BID   Continuous Infusions: . sodium chloride     PRN Meds:.sodium chloride, acetaminophen, hydrOXYzine, ipratropium-albuterol, ondansetron  (ZOFRAN) IV, sodium chloride flush  Assessment/Plan: Acute on chronic diastolic left hert failure Obesity Type 2 DM Hypertension-improved control Headache Anemia of chronic disease Sinus bradycardia-resolved  Agree with clonidine use. Discussed care with daughter   LOS: 9 days    Orpah Cobb  MD  10/17/2017, 9:35 PM

## 2017-10-17 NOTE — Progress Notes (Signed)
@  [phone] paged Merdis DelayK. Schorr of [facility] regarding pt's elevated BPs (200s/80s confirmed manually). Ordered received for PO anti-hypertensive and administered. Will continue to monitor.

## 2017-10-18 DIAGNOSIS — D509 Iron deficiency anemia, unspecified: Secondary | ICD-10-CM

## 2017-10-18 LAB — GLUCOSE, CAPILLARY
GLUCOSE-CAPILLARY: 88 mg/dL (ref 65–99)
Glucose-Capillary: 101 mg/dL — ABNORMAL HIGH (ref 65–99)
Glucose-Capillary: 133 mg/dL — ABNORMAL HIGH (ref 65–99)

## 2017-10-18 MED ORDER — AMLODIPINE BESYLATE 5 MG PO TABS: 5.0000 mg | ORAL_TABLET | Freq: Every day | ORAL | 0 refills | Status: AC

## 2017-10-18 MED ORDER — CLONIDINE HCL 0.1 MG PO TABS: 0.1000 mg | ORAL_TABLET | Freq: Two times a day (BID) | ORAL | Status: DC

## 2017-10-18 MED ORDER — CLONIDINE HCL 0.1 MG PO TABS: 0.1000 mg | ORAL_TABLET | Freq: Two times a day (BID) | ORAL | 0 refills | Status: AC

## 2017-10-18 NOTE — Discharge Instructions (Signed)
Monique DollarElsie M Lewis,  You were treated for heart failure, high blood pressure and slow heart rate. Your heart failure exacerbation has resolved. Your blood pressure medications have been adjusted. Please take as directed and follow-up with your PCP and cardiologist in [facility].

## 2017-10-18 NOTE — Discharge Summary (Signed)
Physician Discharge Summary  Monique Lewis ZOX:[id number] DOB: 10-15-1932 DOA: 10/08/2017  PCP: Verlon Au, MD  Admit date: 10/08/2017 Discharge date: 10/18/2017  Admitted From: Home Disposition: Home  Recommendations for Outpatient Follow-up:  1. Follow up with PCP in 1 week 2. Please obtain BMP/CBC in one week 3. Blood pressure control 4. Please follow up on the following pending results: None  Home Health: PT, RN Equipment/Devices: None  Discharge Condition: Stable CODE STATUS: Full code Diet recommendation: Heart healthy   Brief/Interim Summary:  Admission HPI written by Clydie Braun, MD   HPI: Monique Lewis is a 84 y.o. female with medical history significant of HTN, HLD, diastolic CHF last EF noted to be 60-65% in 05/2017, asthma/COPD, DM type II, and GERD; who presents with complaints of progressively worsening shortness of breath and leg swelling.  The last 2 weeks patient reports having worsening lower extremity swelling.  She chronically notes that her right leg swells more than her left leg due to previous surgical procedure.  However, in the last 4-5 days patient reported worsening shortness of breath.  Associated symptoms include complaints of orthopnea, intermittent wheezing, tightness across her chest, nausea, fatigue, and reports of dark stools approximately 3 weeks ago that have resolved.  Patient had been evaluated by her primary care provider at [facility] 4 days ago and given an injection of Lasix at that time, but noted no change in symptoms.  At home patient reports taking 20 mg of torsemide daily.  She is followed by Dr.  ED Course: Vital signs noted to be relatively stable with oxygen maintained on room air. Mild congestion noted on CXR, and BNP 163.8. Trop is slightly positive at 0.04, but patient without chest pain complaints. Hgb is 7.9, usually 8-10 on care everywhere, and  FOBT negative in ED. Treated with 80 mg IV Lasix.  Transfer to inpatient  bed here at [facility].    Hospital course:  Acute on chronic diastolic heart failure Significant edema on admission which improved with aggressive diuresis with IV lasix 80 mg BID. Good urine output. Now appears mostly euvolemic on exam. Below dry weight with a current weight of 194 lbs on discharge. Continued home Demadex. Cardiology follow-up outpatient.  Hypertensive urgency Secondary to discontinuation of chronic home medications vs rebound from cessation from clonidine vs combination. Treated with losartan to 100 mg daily, amlodipine 5 mg and increase to clonidine 0.1 mg BID. Labetalol/Coreg discontinued secondary to bradycardia. Home RN to recheck blood pressure for close follow-up.  Diabetes mellitus, type 2 Well controlled. Last hemoglobin A1C from 2016 (7.0%). Sliding scale insulin while inpatient.  Anemia Stable. S/p 1 unit of PRBC on 10/09/17. Unknown etiology.  Sinus bradycardia Improved with cessation of beta blocker. Asymptomatic.  Hypokalemia Secondary to diuresis. Resolved with repletion.    Discharge Diagnoses:  Principal Problem:   Acute on chronic diastolic CHF (congestive heart failure) (HCC) Active Problems:   Anemia   DM2 (diabetes mellitus, type 2) [facility])    Discharge Instructions  Discharge Instructions    Call MD for:  persistant dizziness or light-headedness   Complete by:  As directed    Call MD for:  severe uncontrolled pain   Complete by:  As directed    Diet - low sodium heart healthy   Complete by:  As directed    Increase activity slowly   Complete by:  As directed      Allergies as of 10/18/2017      Reactions  Celebrex [celecoxib] Itching   Hydralazine Itching, Other (See Comments)      Ibuprofen Hives   Sulfa Antibiotics Itching   Codeine Itching   Isosorbide Mononitrate [isosorbide Dinitrate Er] Hives, Itching   Shellfish Allergy Itching   Iodine Rash   Latex Rash      Medication List    STOP taking these  medications   carvedilol 3.125 MG tablet Commonly known as:  COREG   labetalol 100 MG tablet Commonly known as:  NORMODYNE     TAKE these medications   albuterol 108 (90 Base) MCG/ACT inhaler Commonly known as:  PROVENTIL HFA;VENTOLIN HFA Inhale 2 puffs into the lungs every 4 (four) hours as needed. For wheezing   amLODipine 5 MG tablet Commonly known as:  NORVASC Take 1 tablet (5 mg total) by mouth daily.   budesonide-formoterol 160-4.5 MCG/ACT inhaler Commonly known as:  SYMBICORT Inhale 2 puffs into the lungs 2 (two) times daily.   clobetasol cream 0.05 % Commonly known as:  TEMOVATE Apply 1 application topically 2 (two) times daily. To right hip   cloNIDine 0.1 MG tablet Commonly known as:  CATAPRES Take 1 tablet (0.1 mg total) by mouth 2 (two) times daily. What changed:  when to take this   desonide 0.05 % cream Commonly known as:  DESOWEN Apply 1 application topically 2 (two) times daily.   doxepin 50 MG capsule Commonly known as:  SINEQUAN Take 50 mg by mouth at bedtime.   escitalopram 10 MG tablet Commonly known as:  LEXAPRO Take 10 mg by mouth daily.   fluticasone 50 MCG/ACT nasal spray Commonly known as:  FLONASE Place 2 sprays into the nose daily.   hydrOXYzine 25 MG tablet Commonly known as:  ATARAX/VISTARIL Take 25 mg by mouth every 4 (four) hours as needed. For itching   losartan 100 MG tablet Commonly known as:  COZAAR Take 100 mg by mouth daily.   pantoprazole 40 MG tablet Commonly known as:  PROTONIX Take 40 mg by mouth daily.   potassium chloride SA 20 MEQ tablet Commonly known as:  K-DUR,KLOR-CON Take 1 tablet (20 mEq total) by mouth 2 (two) times daily.   torsemide 20 MG tablet Commonly known as:  DEMADEX Take 20 mg by mouth 2 (two) times daily.      Follow-up Information    [facility] Follow up.   Specialty:  Home Health Services Why:  Registered Nurse and Physical Therapy Contact  information: [address] [place] [address] [phone]        Verlon Au, MD. Schedule an appointment as soon as possible for a visit in 1 week(s).   Specialty:  Family Medicine Contact information: [address] [facility] [place] [address] [phone]          Allergies  Allergen Reactions  . Celebrex [Celecoxib] Itching  . Hydralazine Itching and Other (See Comments)       . Ibuprofen Hives  . Sulfa Antibiotics Itching  . Codeine Itching  . Isosorbide Mononitrate [Isosorbide Dinitrate Er] Hives and Itching  . Shellfish Allergy Itching  . Iodine Rash  . Latex Rash    Consultations:  Cardiology   Procedures/Studies: Dg Chest 2 View  Result Date: 10/08/2017 CLINICAL DATA:  Bilateral leg swelling and shortness of Breath EXAM: CHEST - 2 VIEW COMPARISON:  07/01/2017 FINDINGS: Cardiac shadow is mildly enlarged but stable. Aortic calcifications and hiatal hernia are again noted and stable. Mild  vascular congestion is noted without interstitial edema. No sizable effusion is noted. Mild hyperinflation of the lungs is noted. IMPRESSION: Mild vascular congestion. COPD. Hiatal hernia. Electronically Signed   By: Alcide CleverMark  Lukens M.D.   On: 10/08/2017 19:48     PRBC, 1 unit (10/09/17)   Transthoracic Echocardiogram (3/15) Study Conclusions  - Left ventricle: The cavity size was normal. There was moderate concentric hypertrophy. Systolic function was vigorous. The estimated ejection fraction was in the range of 65% to 70%. Wall motion was normal; there were no regional wall motion abnormalities. Features are consistent with a pseudonormal left ventricular filling pattern, with concomitant abnormal relaxation and increased filling pressure (grade 2 diastolic dysfunction). - Aortic valve: Trileaflet; mildly thickened, mildly calcified leaflets. There was trivial regurgitation. - Mitral valve: Calcified  annulus. There was severe regurgitation. - Left atrium: The atrium was severely dilated. - Right atrium: The atrium was severely dilated. - Tricuspid valve: There was severe regurgitation. - Pulmonary arteries: Systolic pressure was moderately increased. PA peak pressure: 47 mm Hg (S). - Pericardium, extracardiac: A trivial pericardial effusion was identified posterior to the heart.     Subjective: Feels good today.  Discharge Exam: Vitals:   10/17/17 2200 10/18/17 0537  BP:    Pulse:  (!) 55  Resp:    Temp:  97.9 F (36.6 C)  SpO2: 99% 92%   Vitals:   10/17/17 1600 10/17/17 2129 10/17/17 2200 10/18/17 0537  BP: (!) 145/66 (!) 167/64    Pulse:  62  (!) 55  Resp:  20    Temp:  98.5 F (36.9 C)  97.9 F (36.6 C)  TempSrc:  Oral  Oral  SpO2:  100% 99% 92%  Weight: 87.7 kg (193 lb 4.8 oz)   88.2 kg (194 lb 7.1 oz)  Height:        General: Pt is alert, awake, not in acute distress    The results of significant diagnostics from this hospitalization (including imaging, microbiology, ancillary and laboratory) are listed below for reference.     Microbiology: No results found for this or any previous visit (from the past 240 hour(s)).   Labs: BNP (last 3 results) Recent Labs    10/08/17 1921  BNP 463.8*   Basic Metabolic Panel: Recent Labs  Lab 10/12/17 0421 10/13/17 0746 10/14/17 0626 10/15/17 0405 10/16/17 0359  NA 142 142  142 140 139 138  K 3.6 3.9  3.9 4.1 4.5 3.8  CL 104 106  105 104 101 101  CO2 28 28  28 25 28 29   GLUCOSE 97 98  98 99 114* 130*  BUN 16 17  17 18 20 19   CREATININE 0.94 0.99  0.98 0.93 1.03* 0.93  CALCIUM 8.6* 8.7*  8.7* 8.9 8.9 8.9  PHOS  --  3.8 3.8  --  3.3   Liver Function Tests: Recent Labs  Lab 10/13/17 0746 10/14/17 0626 10/16/17 0359  ALBUMIN 2.9* 2.9* 3.1*   No results for input(s): LIPASE, AMYLASE in the last 168 hours. No results for input(s): AMMONIA in the last 168 hours. CBC: No results for  input(s): WBC, NEUTROABS, HGB, HCT, MCV, PLT in the last 168 hours. Cardiac Enzymes: No results for input(s): CKTOTAL, CKMB, CKMBINDEX, TROPONINI in the last 168 hours. BNP: Invalid input(s): POCBNP CBG: Recent Labs  Lab 10/17/17 0752 10/17/17 1119 10/17/17 1635 10/17/17 2154 10/18/17 0752  GLUCAP 111* 144* 126* 174* 101*   D-Dimer No results for input(s): DDIMER in the last 72 hours.  Hgb A1c No results for input(s): HGBA1C in the last 72 hours. Lipid Profile No results for input(s): CHOL, HDL, LDLCALC, TRIG, CHOLHDL, LDLDIRECT in the last 72 hours. Thyroid function studies No results for input(s): TSH, T4TOTAL, T3FREE, THYROIDAB in the last 72 hours.  Invalid input(s): FREET3 Anemia work up No results for input(s): VITAMINB12, FOLATE, FERRITIN, TIBC, IRON, RETICCTPCT in the last 72 hours. Urinalysis    Component Value Date/Time   COLORURINE YELLOW 08/19/2014 1854   APPEARANCEUR CLOUDY (A) 08/19/2014 1854   LABSPEC 1.012 08/19/2014 1854   PHURINE 6.0 08/19/2014 1854   GLUCOSEU NEGATIVE 08/19/2014 1854   HGBUR NEGATIVE 08/19/2014 1854   BILIRUBINUR NEGATIVE 08/19/2014 1854   KETONESUR NEGATIVE 08/19/2014 1854   PROTEINUR NEGATIVE 08/19/2014 1854   UROBILINOGEN 0.2 08/19/2014 1854   NITRITE NEGATIVE 08/19/2014 1854   LEUKOCYTESUR NEGATIVE 08/19/2014 1854    Time coordinating discharge: Over 30 minutes  SIGNED:   Jacquelin Hawking, MD [facility] 10/18/2017, 10:02 AM Pager [phone]  If 7PM-7AM, please contact night-coverage [email or web address] Password [email or web address]

## 2017-10-18 NOTE — Progress Notes (Signed)
Physical Therapy Treatment Patient Details Name: Monique Lewis MRN: [id number] DOB: May 29, 1933 Today's Date: 10/18/2017    History of Present Illness Monique Lewis is a 84 y.o. female with medical history significant of HTN, HLD, diastolic CHF last EF noted to be 60-65% in 05/2017, asthma/COPD, DM type II, and GERD; who presents with complaints of progressively worsening shortness of breath and leg swelling.    PT Comments    Pt improving well, based on the ease with which she managed toileting and incontinence underwear chang out.    Follow Up Recommendations  Supervision - Intermittent;Home health PT     Equipment Recommendations  None recommended by PT    Recommendations for Other Services       Precautions / Restrictions Precautions Precautions: Fall    Mobility  Bed Mobility               General bed mobility comments: pt is recliner on arrival  Transfers   Equipment used: Rolling walker (2 wheeled) Transfers: Sit to/from Stand Sit to Stand: Supervision;Modified independent (Device/Increase time)         General transfer comment: safe technique  Ambulation/Gait Ambulation/Gait assistance: Supervision;Modified independent (Device/Increase time) Ambulation Distance (Feet): 20 Feet(x2) Assistive device: Rolling walker (2 wheeled) Gait Pattern/deviations: Step-through pattern     General Gait Details: Stopped off at the bathroom.  very steady with good use og the RW.  Pt completed her own peri care and change of "depends" with minimal assist..  pt deferred more ambulation due to "lunch is going to get cold"   Stairs            Wheelchair Mobility    Modified Rankin (Stroke Patients Only)       Balance Overall balance assessment: Needs assistance   Sitting balance-Leahy Scale: Good       Standing balance-Leahy Scale: Fair(to good) Standing balance comment: held to door frame but cleaned up from toileting and changed her incontinence  undergarment                            Cognition Arousal/Alertness: Awake/alert Behavior During Therapy: WFL for tasks assessed/performed Overall Cognitive Status: Within Functional Limits for tasks assessed                                        Exercises      General Comments General comments (skin integrity, edema, etc.): sats maintained in mid to upper 90's and HR stable throughout      Pertinent Vitals/Pain Pain Assessment: No/denies pain    Home Living                      Prior Function            PT Goals (current goals can now be found in the care plan section) Acute Rehab PT Goals PT Goal Formulation: With patient Time For Goal Achievement: 10/18/17 Potential to Achieve Goals: Good Progress towards PT goals: Progressing toward goals    Frequency    Min 3X/week      PT Plan Current plan remains appropriate    Co-evaluation              AM-PAC PT "6 Clicks" Daily Activity  Outcome Measure  Difficulty turning over in bed (including adjusting bedclothes, sheets and blankets)?: None Difficulty moving  from lying on back to sitting on the side of the bed? : None Difficulty sitting down on and standing up from a chair with arms (e.g., wheelchair, bedside commode, etc,.)?: None Help needed moving to and from a bed to chair (including a wheelchair)?: A Little Help needed walking in hospital room?: A Little Help needed climbing 3-5 steps with a railing? : A Little 6 Click Score: 21    End of Session   Activity Tolerance: Patient tolerated treatment well Patient left: in chair;with call bell/phone within reach Nurse Communication: Mobility status PT Visit Diagnosis: Other abnormalities of gait and mobility (R26.89)     Time: [id number] PT Time Calculation (min) (ACUTE ONLY): 16 min  Charges:  $Therapeutic Activity: 8-22 mins                    G Codes:       10/18/2017  [place] BingKen Monique Lewis,  PT [phone]  (pager)   Monique Lewis 10/18/2017, 12:39 PM

## 2017-10-18 NOTE — Care Management Important Message (Signed)
Important Message  Patient Details  Name: Mariam Dollarlsie M Horsfall MRN: [id number] Date of Birth: [birth year]   Medicare Important Message Given:  Yes    Oralia RudMegan P Mikaili Flippin 10/18/2017, 2:47 PM

## 2020-09-27 DEATH — deceased
